# Patient Record
Sex: Female | Born: 1985 | Race: White | Hispanic: No | Marital: Single | Smoking: Never smoker
Health system: Southern US, Community
[De-identification: ages and names within clinical notes are randomized; demographics above are authoritative.]

## PROBLEM LIST (undated history)

## (undated) DIAGNOSIS — N200 Calculus of kidney: Secondary | ICD-10-CM

## (undated) DIAGNOSIS — F32A Depression, unspecified: Secondary | ICD-10-CM

## (undated) DIAGNOSIS — I10 Essential (primary) hypertension: Secondary | ICD-10-CM

## (undated) DIAGNOSIS — M109 Gout, unspecified: Secondary | ICD-10-CM

## (undated) DIAGNOSIS — E119 Type 2 diabetes mellitus without complications: Secondary | ICD-10-CM

## (undated) DIAGNOSIS — F329 Major depressive disorder, single episode, unspecified: Secondary | ICD-10-CM

## (undated) DIAGNOSIS — J45909 Unspecified asthma, uncomplicated: Secondary | ICD-10-CM

## (undated) HISTORY — PX: ANKLE SURGERY: SHX546

## (undated) HISTORY — PX: TONSILLECTOMY: SUR1361

---
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
## 2009-05-24 ENCOUNTER — Emergency Department: Payer: Self-pay | Admitting: Emergency Medicine

## 2013-05-01 DIAGNOSIS — E781 Pure hyperglyceridemia: Secondary | ICD-10-CM | POA: Insufficient documentation

## 2013-05-23 ENCOUNTER — Ambulatory Visit: Payer: Self-pay | Admitting: Family Medicine

## 2013-06-27 DIAGNOSIS — J45909 Unspecified asthma, uncomplicated: Secondary | ICD-10-CM | POA: Insufficient documentation

## 2013-08-19 DIAGNOSIS — L309 Dermatitis, unspecified: Secondary | ICD-10-CM | POA: Insufficient documentation

## 2014-01-14 DIAGNOSIS — F32A Depression, unspecified: Secondary | ICD-10-CM | POA: Insufficient documentation

## 2014-01-14 DIAGNOSIS — E785 Hyperlipidemia, unspecified: Secondary | ICD-10-CM | POA: Insufficient documentation

## 2014-01-14 DIAGNOSIS — F329 Major depressive disorder, single episode, unspecified: Secondary | ICD-10-CM | POA: Insufficient documentation

## 2014-05-01 ENCOUNTER — Ambulatory Visit: Payer: Self-pay | Admitting: Unknown Physician Specialty

## 2014-05-01 LAB — BASIC METABOLIC PANEL
Anion Gap: 6 — ABNORMAL LOW (ref 7–16)
BUN: 17 mg/dL (ref 7–18)
CALCIUM: 9.3 mg/dL (ref 8.5–10.1)
CHLORIDE: 104 mmol/L (ref 98–107)
Co2: 28 mmol/L (ref 21–32)
Creatinine: 0.64 mg/dL (ref 0.60–1.30)
EGFR (African American): >60
Glucose: 96 mg/dL (ref 65–99)
Osmolality: 277 (ref 275–301)
Potassium: 4.1 mmol/L (ref 3.5–5.1)
Sodium: 138 mmol/L (ref 136–145)

## 2014-05-06 ENCOUNTER — Ambulatory Visit: Payer: Self-pay | Admitting: Otolaryngology

## 2014-05-09 LAB — PATHOLOGY REPORT

## 2015-02-08 NOTE — Op Note (Signed)
PATIENT NAME:  Charlotte Holloway, Clotee M MR#:  [id number] DATE OF BIRTH:  July 11, 1986  DATE OF PROCEDURE:  05/06/2014  PREOPERATIVE DIAGNOSES: 1.  Chronic tonsillitis. 2.  Tonsil and adenoid hypertrophy causing airway obstruction.  3.  Hard palate lesion.   POSTOPERATIVE DIAGNOSES:  1.  Chronic tonsillitis. 2.  Tonsil and adenoid hypertrophy causing airway obstruction.  3.  Hard palate lesion.   PROCEDURES PERFORMED:   1.  Tonsillectomy and adenoidectomy.  2.  Excision hard palate lesion.   SURGEON: Cammy CopaPaul H. Neta Upadhyay, M.D.   ANESTHESIA: General.   COMPLICATIONS: None.   ESTIMATED BLOOD LOSS: 50 mL.   DESCRIPTION OF PROCEDURE: The patient was given general anesthesia by oral endotracheal intubation. Davis mouth gag was used to visualize her oropharynx. Her tonsils are huge. They are 4+ and touching the midline. She has a very large tongue as well. The soft palate was retracted. Adenoids were enlarged also. Adenoids were removed with curettage and [employer] Thompson forceps. Bleeding was controlled with direct pressure and silver nitrate cautery. The right tonsil was grasped first, pulled medially. The anterior pillar was incised. This was very scarred down to her tonsillar fossa. Tonsils were removed with electrocautery and blunt dissection. Bleeding was controlled with electrocautery. The mouth gag has to be moved to show the left tonsil. The tonsils were grasped and pulled medially. The anterior pillar is incised. It is dissected from its fossa with electrocautery and blunt dissection. Bleeding was controlled with dry pressure and electrocautery. Once we get the tonsils out there is much more room in the back of her throat. There is a much better airway as well.   The palate had been injected with 1 mL of 1% Xylocaine with epinephrine 1:100,000. The lesion is just to left of midline along the hard palate just in front of the junction of the hard and soft palate. A #11 blade is used to excise the mucosa  all the way around it and then a freer elevator is used to free it up in the area. Curved scissor is then used for trimming at its base.  Bleeding is controlled with electrocautery all the way around the outside and at the base of the lesion. This left a 1 cm hole in the mucosa of the hard palate that could not be sewn together because it is so firmly attached to the palate. There was no significant bleeding here.   The patient tolerated the procedure well. She was awakened and taken to the recovery room in satisfactory condition. There were no operative complications.    ____________________________ Cammy CopaPaul H. Cathleen Yagi, MD phj:sb D: 05/06/2014 12:46:07 ET T: 05/06/2014 13:43:58 ET JOB#: [id number]  cc: Cammy CopaPaul H. Sua Spadafora, MD, <Dictator> Cammy CopaPAUL H Evanne Matsunaga MD ELECTRONICALLY SIGNED 05/09/2014 7:53

## 2015-07-27 ENCOUNTER — Ambulatory Visit: Payer: No Typology Code available for payment source

## 2015-07-27 ENCOUNTER — Ambulatory Visit
Admission: EM | Admit: 2015-07-27 | Discharge: 2015-07-27 | Disposition: A | Discharge status: Home / Self Care | Payer: No Typology Code available for payment source | Attending: Family Medicine | Admitting: Family Medicine

## 2015-07-27 ENCOUNTER — Encounter: Payer: Self-pay | Admitting: *Deleted

## 2015-07-27 DIAGNOSIS — R109 Unspecified abdominal pain: Secondary | ICD-10-CM

## 2015-07-27 DIAGNOSIS — N132 Hydronephrosis with renal and ureteral calculous obstruction: Secondary | ICD-10-CM | POA: Diagnosis not present

## 2015-07-27 DIAGNOSIS — R100 Acute abdomen: Secondary | ICD-10-CM

## 2015-07-27 LAB — COMPREHENSIVE METABOLIC PANEL
ALK PHOS: 61 U/L (ref 38–126)
ALT: 31 U/L (ref 14–54)
ANION GAP: 10 (ref 5–15)
AST: 27 U/L (ref 15–41)
Albumin: 4.4 g/dL (ref 3.5–5.0)
BILIRUBIN TOTAL: 1 mg/dL (ref 0.3–1.2)
BUN: 17 mg/dL (ref 6–20)
CALCIUM: 8.9 mg/dL (ref 8.9–10.3)
CO2: 28 mmol/L (ref 22–32)
Chloride: 98 mmol/L — ABNORMAL LOW (ref 101–111)
Creatinine, Ser: 1 mg/dL (ref 0.44–1.00)
GFR calc non Af Amer: >60 mL/min (ref >60)
Glucose, Bld: 113 mg/dL — ABNORMAL HIGH (ref 65–99)
Potassium: 3.6 mmol/L (ref 3.5–5.1)
SODIUM: 136 mmol/L (ref 135–145)
TOTAL PROTEIN: 7.9 g/dL (ref 6.5–8.1)

## 2015-07-27 LAB — CBC WITH DIFFERENTIAL/PLATELET
BASOS ABS: 0.1 10*3/uL (ref 0–0.1)
Basophils Relative: 1 %
EOS PCT: 1 %
Eosinophils Absolute: 0.2 10*3/uL (ref 0–0.7)
HCT: 41.6 % (ref 35.0–47.0)
HEMOGLOBIN: 14.1 g/dL (ref 12.0–16.0)
LYMPHS PCT: 23 %
Lymphs Abs: 2.8 10*3/uL (ref 1.0–3.6)
MCH: 29.7 pg (ref 26.0–34.0)
MCHC: 33.8 g/dL (ref 32.0–36.0)
MCV: 87.9 fL (ref 80.0–100.0)
Monocytes Absolute: 1.2 10*3/uL — ABNORMAL HIGH (ref 0.2–0.9)
Monocytes Relative: 10 %
NEUTROS ABS: 8 10*3/uL — AB (ref 1.4–6.5)
NEUTROS PCT: 65 %
PLATELETS: 264 10*3/uL (ref 150–440)
RBC: 4.74 MIL/uL (ref 3.80–5.20)
RDW: 13.4 % (ref 11.5–14.5)
WBC: 12.2 10*3/uL — AB (ref 3.6–11.0)

## 2015-07-27 LAB — URINALYSIS COMPLETE WITH MICROSCOPIC (ARMC ONLY)
GLUCOSE, UA: NEGATIVE mg/dL
Ketones, ur: NEGATIVE mg/dL
LEUKOCYTES UA: NEGATIVE
NITRITE: NEGATIVE
Protein, ur: 100 mg/dL — AB
Specific Gravity, Urine: >1.03 — ABNORMAL HIGH (ref 1.005–1.030)
pH: 7 (ref 5.0–8.0)

## 2015-07-27 LAB — PREGNANCY, URINE: PREG TEST UR: NEGATIVE

## 2015-07-27 LAB — AMYLASE: Amylase: 46 U/L (ref 28–100)

## 2015-07-27 LAB — LIPASE, BLOOD: Lipase: 18 U/L — ABNORMAL LOW (ref 22–51)

## 2015-07-27 MED ORDER — ONDANSETRON 8 MG PO TBDP: 8.0000 mg | ORAL_TABLET | Freq: Three times a day (TID) | ORAL | Status: DC | PRN

## 2015-07-27 MED ORDER — ONDANSETRON 8 MG PO TBDP
8.0000 mg | ORAL_TABLET | Freq: Once | ORAL | Status: AC
  Administered 2015-07-27: 8 mg via ORAL

## 2015-07-27 MED ORDER — CIPROFLOXACIN HCL 500 MG PO TABS: 500.0000 mg | ORAL_TABLET | Freq: Two times a day (BID) | ORAL | Status: DC

## 2015-07-27 MED ORDER — OXYCODONE-ACETAMINOPHEN 5-325 MG PO TABS: 1.0000 | ORAL_TABLET | Freq: Three times a day (TID) | ORAL | Status: DC | PRN

## 2015-07-27 MED ORDER — TAMSULOSIN HCL 0.4 MG PO CAPS: 0.4000 mg | ORAL_CAPSULE | Freq: Every day | ORAL | Status: DC

## 2015-07-27 MED ORDER — KETOROLAC TROMETHAMINE 30 MG/ML IJ SOLN
30.0000 mg | Freq: Once | INTRAMUSCULAR | Status: AC
  Administered 2015-07-27: 30 mg via INTRAVENOUS

## 2015-07-27 NOTE — ED Provider Notes (Signed)
CSN: [id number]     Arrival date & time 07/27/15  0807 History   First MD Initiated Contact with Patient 07/27/15 [phone]    Nurse's notes were reviewed Chief Complaint  Patient presents with  . Emesis  . Abdominal Cramping   patient states that it started Friday afternoon she's not keeping anything down. (Consider location/radiation/quality/duration/timing/severity/associated sxs/prior Treatment) Patient is a 29 y.o. female presenting with vomiting and cramps. The history is provided by the patient. No language interpreter was used.  Emesis Severity:  Moderate Duration:  3 days Timing:  Constant Number of daily episodes:  Everytime she eats Quality:  Stomach contents Progression:  Unchanged Chronicity:  New Recent urination:  Increased Relieved by:  Nothing Associated symptoms: abdominal pain and sore throat   Associated symptoms: no arthralgias, no chills, no cough, no fever, no myalgias and no URI   Associated symptoms comment:  The sore throat is coming from the recurrent emesis. Abdominal pain:    Location:  Generalized   Quality:  Cramping   Severity:  Moderate   Timing:  Constant   Progression:  Worsening (When she eats the pain is worse.)   Chronicity:  New Risk factors: diabetes   Risk factors: no alcohol use, no sick contacts and no suspect food intake   Risk factors comment:  History of diabetes one time but not now Abdominal Cramping Associated symptoms include abdominal pain. Pertinent negatives include no chest pain and no shortness of breath.   Patient does not smoke. Was diagnosed diabetic but still longer consider diabetic. She states that there is suspicious that she has polycystic ovarian disease. History reviewed. No pertinent past medical history. Past Surgical History  Procedure Laterality Date  . Tonsillectomy    . Ankle surgery     History reviewed. No pertinent family history. Social History  Substance Use Topics  . Smoking status: Never Smoker   .  Smokeless tobacco: None  . Alcohol Use: No   OB History    No data available     Review of Systems  Constitutional: Negative for chills.  HENT: Positive for sore throat.   Respiratory: Negative for cough and shortness of breath.   Cardiovascular: Negative for chest pain.  Gastrointestinal: Positive for vomiting and abdominal pain.  Musculoskeletal: Negative for myalgias and arthralgias.  Skin: Positive for rash. Negative for pallor.  All other systems reviewed and are negative.   Allergies  Review of patient's allergies indicates no known allergies.  Home Medications   Prior to Admission medications   Medication Sig Start Date End Date Taking? Authorizing Provider  allopurinol (ZYLOPRIM) 300 MG tablet Take 300 mg by mouth daily.   Yes Historical Provider, MD  metFORMIN (GLUCOPHAGE) 500 MG tablet Take by mouth 2 (two) times daily with a meal.   Yes Historical Provider, MD  venlafaxine (EFFEXOR) 37.5 MG tablet Take 37.5 mg by mouth daily.   Yes Historical Provider, MD  ciprofloxacin (CIPRO) 500 MG tablet Take 1 tablet (500 mg total) by mouth 2 (two) times daily. 07/27/15   Hassan Rowan, MD  ondansetron (ZOFRAN ODT) 8 MG disintegrating tablet Take 1 tablet (8 mg total) by mouth every 8 (eight) hours as needed for nausea or vomiting. 07/27/15   Hassan Rowan, MD  oxyCODONE-acetaminophen (PERCOCET) 5-325 MG tablet Take 1 tablet by mouth every 8 (eight) hours as needed for severe pain. 07/27/15   Hassan Rowan, MD  tamsulosin (FLOMAX) 0.4 MG CAPS capsule Take 1 capsule (0.4 mg total) by mouth daily. 07/27/15  Hassan Rowan, MD   Meds Ordered and Administered this Visit   Medications  ondansetron (ZOFRAN-ODT) disintegrating tablet 8 mg (8 mg Oral Given 07/27/15 0908)  ketorolac (TORADOL) 30 MG/ML injection 30 mg (30 mg Intravenous Given 07/27/15 0908)    BP 138/88 mmHg  Pulse 93  Temp(Src) 98.3 F (36.8 C) (Oral)  Ht  (1.626 m)  Wt 232 lb (105.235 kg)  BMI 39.80 kg/m2  SpO2 99%  LMP  06/30/2015 (Approximate) No data found.   Physical Exam  Constitutional: She is oriented to person, place, and time. She appears well-developed and well-nourished.  Obese white female  HENT:  Head: Normocephalic and atraumatic.  Eyes: Conjunctivae are normal. Pupils are equal, round, and reactive to light.  Neck: Normal range of motion. Neck supple.  Cardiovascular: Normal rate, regular rhythm and normal heart sounds.   Pulmonary/Chest: Effort normal and breath sounds normal.  Abdominal: Soft. Normal appearance and bowel sounds are normal. She exhibits no distension. There is generalized tenderness.    Musculoskeletal: Normal range of motion.  Neurological: She is alert and oriented to person, place, and time.  Skin: Skin is warm and dry. No erythema.  Psychiatric: She has a normal mood and affect. Her behavior is normal.  Vitals reviewed.   ED Course  Procedures (including critical care time)  Labs Review Labs Reviewed  CBC WITH DIFFERENTIAL/PLATELET - Abnormal; Notable for the following:    WBC 12.2 (*)    Neutro Abs 8.0 (*)    Monocytes Absolute 1.2 (*)    All other components within normal limits  LIPASE, BLOOD - Abnormal; Notable for the following:    Lipase 18 (*)    All other components within normal limits  URINALYSIS COMPLETEWITH MICROSCOPIC ([facility] ONLY) - Abnormal; Notable for the following:    APPearance CLOUDY (*)    Bilirubin Urine 1+ (*)    Specific Gravity, Urine >1.030 (*)    Hgb urine dipstick 1+ (*)    Protein, ur 100 (*)    Bacteria, UA MANY (*)    Squamous Epithelial / LPF 0-5 (*)    All other components within normal limits  COMPREHENSIVE METABOLIC PANEL - Abnormal; Notable for the following:    Chloride 98 (*)    Glucose, Bld 113 (*)    All other components within normal limits  URINE CULTURE  AMYLASE  PREGNANCY, URINE    Imaging Review Ct Abdomen Pelvis Wo Contrast  07/27/2015   CLINICAL DATA:  Central abdominal pain, 3 days duration,  with nausea and vomiting. Personal history of kidney stones.  EXAM: CT ABDOMEN AND PELVIS WITHOUT CONTRAST  TECHNIQUE: Multidetector CT imaging of the abdomen and pelvis was performed following the standard protocol without IV contrast.  COMPARISON:  Ultrasound 05/23/2013.  CT 05/25/2009.  FINDINGS: There is a lingular scar. No active process seen in the lung bases. No pleural or pericardial fluid. No focal liver finding. No calcified gallstones. The spleen is at the upper limits of normal in size but does not show any focal finding. The pancreas is normal. The adrenal glands are normal. The left kidney is normal. The right kidney is swollen and there is moderate right hydronephrosis. There is an obstructing stone at the right UPJ measuring 6 x 10 mm. There is an additional nonobstructing 2 mm stone in the midportion of the right kidney. No stone more distally in the ureter. No stone in the bladder.  The aorta and IVC are normal. No retroperitoneal mass or adenopathy. No  free intraperitoneal fluid or air. No bowel pathology. Uterus and adnexal regions appear normal. No acute bone finding. Degenerative disc disease L5-S1.  IMPRESSION: 6 x 10 mm stone at the right UPJ with moderate right hydronephrosis.   Electronically Signed   By: Paulina Fusi M.D.   On: 07/27/2015 11:21    .thik Visual Acuity Review  Right Eye Distance:   Left Eye Distance:   Bilateral Distance:    Right Eye Near:   Left Eye Near:    Bilateral Near:       Results for orders placed or performed during the hospital encounter of 07/27/15  CBC with Differential  Result Value Ref Range   WBC 12.2 (H) 3.6 - 11.0 K/uL   RBC 4.74 3.80 - 5.20 MIL/uL   Hemoglobin 14.1 12.0 - 16.0 g/dL   HCT 96.0 45.4 - 09.8 %   MCV 87.9 80.0 - 100.0 fL   MCH 29.7 26.0 - 34.0 pg   MCHC 33.8 32.0 - 36.0 g/dL   RDW 11.9 14.7 - 82.9 %   Platelets 264 150 - 440 K/uL   Neutrophils Relative % 65 %   Neutro Abs 8.0 (H) 1.4 - 6.5 K/uL   Lymphocytes  Relative 23 %   Lymphs Abs 2.8 1.0 - 3.6 K/uL   Monocytes Relative 10 %   Monocytes Absolute 1.2 (H) 0.2 - 0.9 K/uL   Eosinophils Relative 1 %   Eosinophils Absolute 0.2 0 - 0.7 K/uL   Basophils Relative 1 %   Basophils Absolute 0.1 0 - 0.1 K/uL  Lipase, blood  Result Value Ref Range   Lipase 18 (L) 22 - 51 U/L  Amylase  Result Value Ref Range   Amylase 46 28 - 100 U/L  Urinalysis complete, with microscopic  Result Value Ref Range   Color, Urine YELLOW YELLOW   APPearance CLOUDY (A) CLEAR   Glucose, UA NEGATIVE NEGATIVE mg/dL   Bilirubin Urine 1+ (A) NEGATIVE   Ketones, ur NEGATIVE NEGATIVE mg/dL   Specific Gravity, Urine >1.030 (H) 1.005 - 1.030   Hgb urine dipstick 1+ (A) NEGATIVE   pH 7.0 5.0 - 8.0   Protein, ur 100 (A) NEGATIVE mg/dL   Nitrite NEGATIVE NEGATIVE   Leukocytes, UA NEGATIVE NEGATIVE   RBC / HPF 6-30 <3 RBC/hpf   WBC, UA 0-5 <3 WBC/hpf   Bacteria, UA MANY (A) RARE   Squamous Epithelial / LPF 0-5 (A) RARE  Comprehensive metabolic panel  Result Value Ref Range   Sodium 136 135 - 145 mmol/L   Potassium 3.6 3.5 - 5.1 mmol/L   Chloride 98 (L) 101 - 111 mmol/L   CO2 28 22 - 32 mmol/L   Glucose, Bld 113 (H) 65 - 99 mg/dL   BUN 17 6 - 20 mg/dL   Creatinine, Ser 5.62 0.44 - 1.00 mg/dL   Calcium 8.9 8.9 - 13.0 mg/dL   Total Protein 7.9 6.5 - 8.1 g/dL   Albumin 4.4 3.5 - 5.0 g/dL   AST 27 15 - 41 U/L   ALT 31 14 - 54 U/L   Alkaline Phosphatase 61 38 - 126 U/L   Total Bilirubin 1.0 0.3 - 1.2 mg/dL   GFR calc non Af Amer >60 >60 mL/min   GFR calc Af Amer >60 >60 mL/min   Anion gap 10 5 - 15  Pregnancy, urine  Result Value Ref Range   Preg Test, Ur NEGATIVE NEGATIVE  MDM   1. Ureteral stone with hydronephrosis   2. Abdominal pain, acute     Patient informed she has a 6 x 10 mm stone. Because of the size of stone will make a urology referral place on Flomax to try to help promote stone have a strain her urine. Cipro for what looks echo  early infection that may be starting to except the pain and Zofran for nausea. Work note for Monday and Tuesday and return back to work on Wednesday hopefully she'll see the urologist before.  Hassan Rowan, MD 07/27/15 [phone]

## 2015-07-27 NOTE — Discharge Instructions (Signed)
Hydronephrosis °Hydronephrosis is the enlargement of a kidney due to a blockage that stops urine from flowing out of the body. °CAUSES °Common causes of this condition include: °· A birth (congenital) defect of the kidney. °· A congenital defect of the tube through which urine travels (ureter). °· Kidney stones. °· An enlarged prostate gland. °· A tumor. °· Cancer of the prostate, bladder, uterus, ovary, or colon. °· A blood clot. °SYMPTOMS °Symptoms of this condition include: °· Pain or discomfort in your side (flank). °· Swelling of the abdomen. °· Pain in the abdomen. °· Nausea and vomiting. °· Fever. °· Pain while passing urine. °· Feeling of urgency to urinate. °· Frequent urination. °· Infection of the urinary tract. °In some cases, there are no symptoms. °DIAGNOSIS °This condition may be diagnosed with: °· A medical history. °· A physical exam. °· Blood and urine tests to check kidney function. °· Imaging tests, such as an X-ray, ultrasound, CT scan, or MRI. °· A test in which a rigid or flexible telescope (cystoscope) is used to view the site of the blockage. °TREATMENT °Treatment for this condition depends on where the blockage is located, how long it has been there, and what caused it. The goal of treatment is to remove the blockage. Treatment options include: °· A procedure to put in a soft tube to help drain urine. °· Antibiotic medicines to treat or prevent infection. °· Shock-wave therapy (lithotripsy) to help eliminate kidney stones. °HOME CARE INSTRUCTIONS °· Get lots of rest. °· Drink enough fluid to keep your urine clear or pale yellow. °· If you have a drain in, follow your health care provider's instructions about how to care for it. °· Take medicines only as directed by your health care provider. °· If you were prescribed an antibiotic medicine, finish all of it even if you start to feel better. °· Keep all follow-up visits as directed by your health care provider. This is important. °SEEK  MEDICAL CARE IF: °· You continue to have symptoms after treatment. °· You develop new symptoms. °· You have a problem with a drainage device. °· Your urine becomes cloudy or bloody. °· You have a fever. °SEEK IMMEDIATE MEDICAL CARE IF: °· You have severe flank or abdominal pain. °· You develop vomiting and are unable to keep fluids down. °  °This information is not intended to replace advice given to you by your health care provider. Make sure you discuss any questions you have with your health care provider. °  °Document Released: 08/01/2007 Document Revised: 02/18/2015 Document Reviewed: 09/30/2014 °Elsevier Interactive Patient Education ©2016 [employer] ° °Kidney Stones °Kidney stones (urolithiasis) are solid masses that form inside your kidneys. The intense pain is caused by the stone moving through the kidney, ureter, bladder, and urethra (urinary tract). When the stone moves, the ureter starts to spasm around the stone. The stone is usually passed in your pee (urine).  °HOME CARE °· Drink enough fluids to keep your pee clear or pale yellow. This helps to get the stone out. °· Take a 24-hour pee (urine) sample as told by your doctor. You may need to take another sample every 6-12 months. °· Strain all pee through the provided strainer. Do not pee without peeing through the strainer, not even once. If you pee the stone out, catch it in the strainer. The stone may be as small as a grain of salt. Take this to your doctor. This will help your doctor figure out what you can do   to try to prevent more kidney stones. °· Only take medicine as told by your doctor. °· Make changes to your daily diet as told by your doctor. You may be told to: °¨ Limit how much salt you eat. °¨ Eat 5 or more servings of fruits and vegetables each day. °¨ Limit how much meat, poultry, fish, and eggs you eat. °· Keep all follow-up visits as told by your doctor. This is important. °· Get follow-up X-rays as told by your doctor. °GET HELP  IF: °You have pain that gets worse even if you have been taking pain medicine. °GET HELP RIGHT AWAY IF:  °· Your pain does not get better with medicine. °· You have a fever or shaking chills. °· Your pain increases and gets worse over 18 hours. °· You have new belly (abdominal) pain. °· You feel faint or pass out. °· You are unable to pee. °  °This information is not intended to replace advice given to you by your health care provider. Make sure you discuss any questions you have with your health care provider. °  °Document Released: 03/22/2008 Document Revised: 06/25/2015 Document Reviewed: 03/07/2013 °Elsevier Interactive Patient Education ©2016 [employer] ° °

## 2015-07-27 NOTE — ED Notes (Signed)
Pt states that she has nausea, abdominal cramping and vomiting since Friday, pt states that she has not been able to eat with out vomiting

## 2015-07-28 LAB — URINE CULTURE

## 2015-07-29 NOTE — ED Notes (Signed)
Final report urine C&S suggestive of contamination ; result shows multiple species

## 2016-06-22 DIAGNOSIS — Z87442 Personal history of urinary calculi: Secondary | ICD-10-CM | POA: Insufficient documentation

## 2016-06-22 DIAGNOSIS — E119 Type 2 diabetes mellitus without complications: Secondary | ICD-10-CM | POA: Insufficient documentation

## 2019-05-23 ENCOUNTER — Encounter
Admission: EM | Disposition: A | Discharge status: Home / Self Care | Payer: Self-pay | Source: Ambulatory Visit | Attending: Urology

## 2019-05-23 ENCOUNTER — Emergency Department: Payer: BLUE CROSS/BLUE SHIELD | Admitting: Anesthesiology

## 2019-05-23 ENCOUNTER — Other Ambulatory Visit: Payer: Self-pay

## 2019-05-23 ENCOUNTER — Inpatient Hospital Stay
Admission: EM | Admit: 2019-05-23 | Discharge: 2019-05-25 | DRG: 854 | Disposition: A | Discharge status: Home / Self Care | Payer: BLUE CROSS/BLUE SHIELD | Source: Ambulatory Visit | Attending: Urology | Admitting: Urology

## 2019-05-23 ENCOUNTER — Emergency Department: Payer: BLUE CROSS/BLUE SHIELD

## 2019-05-23 ENCOUNTER — Encounter: Payer: Self-pay | Admitting: Emergency Medicine

## 2019-05-23 ENCOUNTER — Ambulatory Visit
Admission: EM | Admit: 2019-05-23 | Discharge: 2019-05-23 | Disposition: A | Discharge status: Other Acute Inpatient Hospital | Payer: BLUE CROSS/BLUE SHIELD | Source: Home / Self Care | Attending: Family Medicine | Admitting: Family Medicine

## 2019-05-23 DIAGNOSIS — B9620 Unspecified Escherichia coli [E. coli] as the cause of diseases classified elsewhere: Secondary | ICD-10-CM | POA: Diagnosis present

## 2019-05-23 DIAGNOSIS — R1011 Right upper quadrant pain: Secondary | ICD-10-CM

## 2019-05-23 DIAGNOSIS — E669 Obesity, unspecified: Secondary | ICD-10-CM | POA: Diagnosis present

## 2019-05-23 DIAGNOSIS — A419 Sepsis, unspecified organism: Secondary | ICD-10-CM

## 2019-05-23 DIAGNOSIS — N201 Calculus of ureter: Secondary | ICD-10-CM | POA: Diagnosis not present

## 2019-05-23 DIAGNOSIS — J45909 Unspecified asthma, uncomplicated: Secondary | ICD-10-CM | POA: Diagnosis present

## 2019-05-23 DIAGNOSIS — Z20828 Contact with and (suspected) exposure to other viral communicable diseases: Secondary | ICD-10-CM | POA: Diagnosis present

## 2019-05-23 DIAGNOSIS — A4151 Sepsis due to Escherichia coli [E. coli]: Principal | ICD-10-CM | POA: Diagnosis present

## 2019-05-23 DIAGNOSIS — F329 Major depressive disorder, single episode, unspecified: Secondary | ICD-10-CM | POA: Diagnosis present

## 2019-05-23 DIAGNOSIS — N132 Hydronephrosis with renal and ureteral calculous obstruction: Secondary | ICD-10-CM

## 2019-05-23 DIAGNOSIS — M109 Gout, unspecified: Secondary | ICD-10-CM | POA: Diagnosis present

## 2019-05-23 DIAGNOSIS — Z79899 Other long term (current) drug therapy: Secondary | ICD-10-CM

## 2019-05-23 DIAGNOSIS — R109 Unspecified abdominal pain: Secondary | ICD-10-CM

## 2019-05-23 DIAGNOSIS — R509 Fever, unspecified: Secondary | ICD-10-CM

## 2019-05-23 DIAGNOSIS — N200 Calculus of kidney: Secondary | ICD-10-CM

## 2019-05-23 DIAGNOSIS — N136 Pyonephrosis: Secondary | ICD-10-CM | POA: Diagnosis present

## 2019-05-23 DIAGNOSIS — Z7984 Long term (current) use of oral hypoglycemic drugs: Secondary | ICD-10-CM

## 2019-05-23 DIAGNOSIS — E119 Type 2 diabetes mellitus without complications: Secondary | ICD-10-CM | POA: Diagnosis present

## 2019-05-23 DIAGNOSIS — Z6836 Body mass index (BMI) 36.0-36.9, adult: Secondary | ICD-10-CM

## 2019-05-23 DIAGNOSIS — I10 Essential (primary) hypertension: Secondary | ICD-10-CM | POA: Diagnosis present

## 2019-05-23 DIAGNOSIS — Z87442 Personal history of urinary calculi: Secondary | ICD-10-CM

## 2019-05-23 HISTORY — DX: Unspecified asthma, uncomplicated: J45.909

## 2019-05-23 HISTORY — DX: Depression, unspecified: F32.A

## 2019-05-23 HISTORY — DX: Gout, unspecified: M10.9

## 2019-05-23 HISTORY — DX: Type 2 diabetes mellitus without complications: E11.9

## 2019-05-23 HISTORY — PX: CYSTOSCOPY WITH STENT PLACEMENT: SHX5790

## 2019-05-23 HISTORY — DX: Calculus of kidney: N20.0

## 2019-05-23 HISTORY — DX: Essential (primary) hypertension: I10

## 2019-05-23 LAB — CBC WITH DIFFERENTIAL/PLATELET
Abs Immature Granulocytes: 0.17 10*3/uL — ABNORMAL HIGH (ref 0.00–0.07)
Basophils Absolute: 0.1 10*3/uL (ref 0.0–0.1)
Basophils Relative: 1 %
Eosinophils Absolute: 0 10*3/uL (ref 0.0–0.5)
Eosinophils Relative: 0 %
HCT: 32.4 % — ABNORMAL LOW (ref 36.0–46.0)
Hemoglobin: 10.8 g/dL — ABNORMAL LOW (ref 12.0–15.0)
Immature Granulocytes: 1 %
Lymphocytes Relative: 11 %
Lymphs Abs: 1.7 10*3/uL (ref 0.7–4.0)
MCH: 28.1 pg (ref 26.0–34.0)
MCHC: 33.3 g/dL (ref 30.0–36.0)
MCV: 84.2 fL (ref 80.0–100.0)
Monocytes Absolute: 1.4 10*3/uL — ABNORMAL HIGH (ref 0.1–1.0)
Monocytes Relative: 10 %
Neutro Abs: 11.4 10*3/uL — ABNORMAL HIGH (ref 1.7–7.7)
Neutrophils Relative %: 77 %
Platelets: 406 10*3/uL — ABNORMAL HIGH (ref 150–400)
RBC: 3.85 MIL/uL — ABNORMAL LOW (ref 3.87–5.11)
RDW: 15.5 % (ref 11.5–15.5)
WBC: 14.8 10*3/uL — ABNORMAL HIGH (ref 4.0–10.5)
nRBC: 0 % (ref 0.0–0.2)

## 2019-05-23 LAB — URINALYSIS, COMPLETE (UACMP) WITH MICROSCOPIC
Glucose, UA: NEGATIVE mg/dL
Ketones, ur: NEGATIVE mg/dL
Nitrite: NEGATIVE
Protein, ur: 100 mg/dL — AB
Specific Gravity, Urine: 1.02 (ref 1.005–1.030)
WBC, UA: >50 WBC/hpf (ref 0–5)
pH: 6 (ref 5.0–8.0)

## 2019-05-23 LAB — LIPASE, BLOOD: Lipase: 22 U/L (ref 11–51)

## 2019-05-23 LAB — COMPREHENSIVE METABOLIC PANEL
ALT: 10 U/L (ref 0–44)
AST: 10 U/L — ABNORMAL LOW (ref 15–41)
Albumin: 3.3 g/dL — ABNORMAL LOW (ref 3.5–5.0)
Alkaline Phosphatase: 66 U/L (ref 38–126)
Anion gap: 12 (ref 5–15)
BUN: 14 mg/dL (ref 6–20)
CO2: 18 mmol/L — ABNORMAL LOW (ref 22–32)
Calcium: 8.3 mg/dL — ABNORMAL LOW (ref 8.9–10.3)
Chloride: 99 mmol/L (ref 98–111)
Creatinine, Ser: 1.08 mg/dL — ABNORMAL HIGH (ref 0.44–1.00)
GFR calc Af Amer: >60 mL/min (ref >60)
GFR calc non Af Amer: >60 mL/min (ref >60)
Glucose, Bld: 107 mg/dL — ABNORMAL HIGH (ref 70–99)
Potassium: 3.6 mmol/L (ref 3.5–5.1)
Sodium: 129 mmol/L — ABNORMAL LOW (ref 135–145)
Total Bilirubin: 0.7 mg/dL (ref 0.3–1.2)
Total Protein: 7.8 g/dL (ref 6.5–8.1)

## 2019-05-23 LAB — LACTIC ACID, PLASMA: Lactic Acid, Venous: 1.1 mmol/L (ref 0.5–1.9)

## 2019-05-23 LAB — POCT PREGNANCY, URINE: Preg Test, Ur: NEGATIVE

## 2019-05-23 LAB — SARS CORONAVIRUS 2 BY RT PCR (HOSPITAL ORDER, PERFORMED IN ~~LOC~~ HOSPITAL LAB): SARS Coronavirus 2: NEGATIVE

## 2019-05-23 LAB — GLUCOSE, CAPILLARY
Glucose-Capillary: 98 mg/dL (ref 70–99)
Glucose-Capillary: 119 mg/dL — ABNORMAL HIGH (ref 70–99)

## 2019-05-23 LAB — POC URINE PREG, ED: Preg Test, Ur: NEGATIVE

## 2019-05-23 SURGERY — CYSTOSCOPY, WITH STENT INSERTION
Anesthesia: General | Laterality: Right

## 2019-05-23 MED ORDER — IOHEXOL 300 MG/ML  SOLN
100.0000 mL | Freq: Once | INTRAMUSCULAR | Status: AC | PRN
  Administered 2019-05-23: 18:00:00 100 mL via INTRAVENOUS

## 2019-05-23 MED ORDER — ALBUTEROL SULFATE HFA 108 (90 BASE) MCG/ACT IN AERS
INHALATION_SPRAY | RESPIRATORY_TRACT | Status: AC
  Filled 2019-05-23: qty 6.7

## 2019-05-23 MED ORDER — MEPERIDINE HCL 50 MG/ML IJ SOLN
12.5000 mg | Freq: Once | INTRAMUSCULAR | Status: AC
  Administered 2019-05-23: 22:00:00 12.5 mg via INTRAVENOUS

## 2019-05-23 MED ORDER — ONDANSETRON HCL 4 MG/2ML IJ SOLN: 4.0000 mg | Freq: Once | INTRAMUSCULAR | Status: DC

## 2019-05-23 MED ORDER — FENTANYL CITRATE (PF) 100 MCG/2ML IJ SOLN
INTRAMUSCULAR | Status: DC | PRN
  Administered 2019-05-23: 50 ug via INTRAVENOUS

## 2019-05-23 MED ORDER — LIDOCAINE HCL (CARDIAC) PF 100 MG/5ML IV SOSY
PREFILLED_SYRINGE | INTRAVENOUS | Status: DC | PRN
  Administered 2019-05-23: 50 mg via INTRAVENOUS

## 2019-05-23 MED ORDER — ONDANSETRON HCL 4 MG/2ML IJ SOLN: 4.0000 mg | Freq: Once | INTRAMUSCULAR | Status: DC | PRN

## 2019-05-23 MED ORDER — SUCCINYLCHOLINE CHLORIDE 20 MG/ML IJ SOLN
INTRAMUSCULAR | Status: DC | PRN
  Administered 2019-05-23: 100 mg via INTRAVENOUS

## 2019-05-23 MED ORDER — MORPHINE SULFATE (PF) 4 MG/ML IV SOLN
4.0000 mg | Freq: Once | INTRAVENOUS | Status: AC
  Administered 2019-05-23: 17:00:00 4 mg via INTRAVENOUS
  Filled 2019-05-23: qty 1

## 2019-05-23 MED ORDER — PROPOFOL 10 MG/ML IV BOLUS
INTRAVENOUS | Status: AC
  Filled 2019-05-23: qty 20

## 2019-05-23 MED ORDER — SODIUM CHLORIDE 0.9 % IV BOLUS
500.0000 mL | Freq: Once | INTRAVENOUS | Status: AC
  Administered 2019-05-23: 17:00:00 500 mL via INTRAVENOUS

## 2019-05-23 MED ORDER — MIDAZOLAM HCL 2 MG/2ML IJ SOLN
INTRAMUSCULAR | Status: AC
  Administered 2019-05-23: 21:00:00 1 mg via INTRAVENOUS
  Filled 2019-05-23: qty 2

## 2019-05-23 MED ORDER — ONDANSETRON HCL 4 MG/2ML IJ SOLN
4.0000 mg | Freq: Once | INTRAMUSCULAR | Status: AC
  Administered 2019-05-23: 17:00:00 4 mg via INTRAVENOUS
  Filled 2019-05-23: qty 2

## 2019-05-23 MED ORDER — ACETAMINOPHEN 500 MG PO TABS
1000.0000 mg | ORAL_TABLET | Freq: Once | ORAL | Status: AC
  Administered 2019-05-23: 23:00:00 1000 mg via ORAL

## 2019-05-23 MED ORDER — PROPOFOL 10 MG/ML IV BOLUS
INTRAVENOUS | Status: DC | PRN
  Administered 2019-05-23: 150 mg via INTRAVENOUS

## 2019-05-23 MED ORDER — IPRATROPIUM-ALBUTEROL 0.5-2.5 (3) MG/3ML IN SOLN
3.0000 mL | Freq: Once | RESPIRATORY_TRACT | Status: AC
  Administered 2019-05-23: 22:00:00 3 mL via RESPIRATORY_TRACT

## 2019-05-23 MED ORDER — LABETALOL HCL 5 MG/ML IV SOLN
5.0000 mg | INTRAVENOUS | Status: AC | PRN
  Administered 2019-05-23 (×3): 5 mg via INTRAVENOUS

## 2019-05-23 MED ORDER — IOHEXOL 240 MG/ML SOLN
50.0000 mL | Freq: Once | INTRAMUSCULAR | Status: AC | PRN
  Administered 2019-05-23: 16:00:00 50 mL via ORAL

## 2019-05-23 MED ORDER — MIDAZOLAM HCL 2 MG/2ML IJ SOLN
INTRAMUSCULAR | Status: AC
  Filled 2019-05-23: qty 2

## 2019-05-23 MED ORDER — SODIUM CHLORIDE 0.9 % IV BOLUS
1000.0000 mL | Freq: Once | INTRAVENOUS | Status: AC
  Administered 2019-05-23: 1000 mL via INTRAVENOUS

## 2019-05-23 MED ORDER — MIDAZOLAM HCL 2 MG/2ML IJ SOLN
INTRAMUSCULAR | Status: DC | PRN
  Administered 2019-05-23: 2 mg via INTRAVENOUS

## 2019-05-23 MED ORDER — GENTAMICIN SULFATE 40 MG/ML IJ SOLN
5.0000 mg/kg | INTRAVENOUS | Status: DC
  Filled 2019-05-23: qty 11.75

## 2019-05-23 MED ORDER — FENTANYL CITRATE (PF) 100 MCG/2ML IJ SOLN: 25.0000 ug | INTRAMUSCULAR | Status: DC | PRN

## 2019-05-23 MED ORDER — SODIUM CHLORIDE 0.9 % IV SOLN
1.0000 g | Freq: Once | INTRAVENOUS | Status: AC
  Administered 2019-05-23: 17:00:00 1 g via INTRAVENOUS
  Filled 2019-05-23: qty 10

## 2019-05-23 MED ORDER — LABETALOL HCL 5 MG/ML IV SOLN
INTRAVENOUS | Status: AC
  Administered 2019-05-23: 22:00:00 5 mg via INTRAVENOUS
  Filled 2019-05-23: qty 4

## 2019-05-23 MED ORDER — IOTHALAMATE MEGLUMINE 43 % IV SOLN
INTRAVENOUS | Status: DC | PRN
  Administered 2019-05-23: 10 mL via SURGICAL_CAVITY

## 2019-05-23 MED ORDER — ACETAMINOPHEN 500 MG PO TABS
1000.0000 mg | ORAL_TABLET | ORAL | Status: AC
  Administered 2019-05-23: 16:00:00 1000 mg via ORAL
  Filled 2019-05-23: qty 2

## 2019-05-23 MED ORDER — MEPERIDINE HCL 50 MG/ML IJ SOLN
INTRAMUSCULAR | Status: AC
  Administered 2019-05-23: 22:00:00 12.5 mg via INTRAVENOUS
  Filled 2019-05-23: qty 1

## 2019-05-23 MED ORDER — GENTAMICIN SULFATE 40 MG/ML IJ SOLN
5.0000 mg/kg | INTRAVENOUS | Status: DC
  Administered 2019-05-23: 20:00:00 474 mg via INTRAVENOUS
  Filled 2019-05-23 (×2): qty 11.75

## 2019-05-23 MED ORDER — SODIUM CHLORIDE 0.9 % IV SOLN
INTRAVENOUS | Status: DC | PRN
  Administered 2019-05-23: 20:00:00 via INTRAVENOUS

## 2019-05-23 MED ORDER — IPRATROPIUM-ALBUTEROL 0.5-2.5 (3) MG/3ML IN SOLN
RESPIRATORY_TRACT | Status: AC
  Administered 2019-05-23: 22:00:00 3 mL via RESPIRATORY_TRACT
  Filled 2019-05-23: qty 3

## 2019-05-23 MED ORDER — ACETAMINOPHEN 500 MG PO TABS: 1000.0000 mg | ORAL_TABLET | Freq: Once | ORAL | Status: DC

## 2019-05-23 MED ORDER — ACETAMINOPHEN 500 MG PO TABS: 500.0000 mg | ORAL_TABLET | Freq: Once | ORAL | Status: DC

## 2019-05-23 MED ORDER — ONDANSETRON HCL 4 MG/2ML IJ SOLN
INTRAMUSCULAR | Status: DC | PRN
  Administered 2019-05-23: 4 mg via INTRAVENOUS

## 2019-05-23 MED ORDER — MIDAZOLAM HCL 2 MG/2ML IJ SOLN
1.0000 mg | Freq: Once | INTRAMUSCULAR | Status: AC
  Administered 2019-05-23: 21:00:00 1 mg via INTRAVENOUS

## 2019-05-23 MED ORDER — ACETAMINOPHEN 500 MG PO TABS
ORAL_TABLET | ORAL | Status: AC
  Administered 2019-05-23: 23:00:00 1000 mg via ORAL
  Filled 2019-05-23: qty 2

## 2019-05-23 MED ORDER — FENTANYL CITRATE (PF) 100 MCG/2ML IJ SOLN
INTRAMUSCULAR | Status: AC
  Filled 2019-05-23: qty 2

## 2019-05-23 SURGICAL SUPPLY — 23 items
BAG DRAIN CYSTO-URO LG1000N (MISCELLANEOUS) ×3 IMPLANT
BAG URO DRAIN 4000ML (MISCELLANEOUS) ×3 IMPLANT
BRUSH SCRUB EZ  4% CHG (MISCELLANEOUS)
BRUSH SCRUB EZ 4% CHG (MISCELLANEOUS) IMPLANT
CATH FOL 2WAY LX 16X30 (CATHETERS) ×3 IMPLANT
CATH URETL 5X70 OPEN END (CATHETERS) ×3 IMPLANT
CONRAY 43 FOR UROLOGY 50M (MISCELLANEOUS) ×3 IMPLANT
COVER WAND RF STERILE (DRAPES) ×3 IMPLANT
GLOVE BIO SURGEON STRL SZ 6.5 (GLOVE) ×2 IMPLANT
GLOVE BIO SURGEONS STRL SZ 6.5 (GLOVE) ×1
GOWN STRL REUS W/ TWL LRG LVL3 (GOWN DISPOSABLE) ×2 IMPLANT
GOWN STRL REUS W/TWL LRG LVL3 (GOWN DISPOSABLE) ×4
GUIDEWIRE STR DUAL SENSOR (WIRE) ×3 IMPLANT
KIT TURNOVER CYSTO (KITS) ×3 IMPLANT
PACK CYSTO AR (MISCELLANEOUS) ×3 IMPLANT
SET CYSTO W/LG BORE CLAMP LF (SET/KITS/TRAYS/PACK) ×3 IMPLANT
SOL .9 NS 3000ML IRR  AL (IV SOLUTION) ×2
SOL .9 NS 3000ML IRR UROMATIC (IV SOLUTION) ×1 IMPLANT
STENT URET 6FRX24 CONTOUR (STENTS) ×3 IMPLANT
STENT URET 6FRX26 CONTOUR (STENTS) IMPLANT
SURGILUBE 2OZ TUBE FLIPTOP (MISCELLANEOUS) ×3 IMPLANT
SYRINGE IRR TOOMEY STRL 70CC (SYRINGE) ×3 IMPLANT
WATER STERILE IRR 1000ML POUR (IV SOLUTION) ×3 IMPLANT

## 2019-05-23 NOTE — ED Provider Notes (Signed)
[facility] Emergency Department Provider Note  ____________________________________________   First MD Initiated Contact with Patient 05/23/19 1558     (approximate)  I have reviewed the triage vital signs and the nursing notes.   HISTORY  Chief Complaint Flank Pain   HPI Charlotte Holloway is a 33 y.o. female here for evaluation of right flank and right back pain with fever  Patient reports for the last couple days she has not felt well, she has been experiencing discomfort in her right mid flank area along with fever, has vomited a couple times due to "pain".  Reports she is had some waxing and waning episodes of pain located in her right upper abdomen and right flank.  Denies pregnancy.  No vaginal bleeding.  No lower or pelvic pain.  Has not noticed any trouble urinating, no dark or abnormal odor.  No chest pain.  No exposure anyone known to have coronavirus.  No cough.  No runny nose no upper respiratory symptoms  Went to urgent care, they evaluated her and recommended she come here to be further evaluated and possibly have a CT scan to check see if she could have a kidney stone or problem with her gallbladder   Past Medical History:  Diagnosis Date   Asthma    Depression    Diabetes mellitus without complication (HCC)    Gout    Hypertension    Kidney stone     There are no active problems to display for this patient.   Past Surgical History:  Procedure Laterality Date   ANKLE SURGERY     TONSILLECTOMY      Prior to Admission medications   Medication Sig Start Date End Date Taking? Authorizing Provider  allopurinol (ZYLOPRIM) 300 MG tablet Take 300 mg by mouth daily.    [provider]  metFORMIN (GLUCOPHAGE) 500 MG tablet Take by mouth 2 (two) times daily with a meal.    [provider]  tamsulosin (FLOMAX) 0.4 MG CAPS capsule Take 1 capsule (0.4 mg total) by mouth daily. 07/27/15   Hassan RowanWade, Eugene, MD    venlafaxine (EFFEXOR) 37.5 MG tablet Take 37.5 mg by mouth daily.    [provider]    Allergies Patient has no known allergies.  History reviewed. No pertinent family history.  Social History Social History   Tobacco Use   Smoking status: Never Smoker   Smokeless tobacco: Never Used  Substance Use Topics   Alcohol use: No   Drug use: No    Review of Systems Constitutional: Fever and chills and some fatigue Eyes: No visual changes. ENT: No sore throat. Cardiovascular: Denies chest pain. Respiratory: Denies shortness of breath. Gastrointestinal: See HPI.  No black or bloody vomit.  Has had a couple episodes of vomiting.  No loose stools or diarrhea Genitourinary: Negative for dysuria. Musculoskeletal: Negative for back pain except over her right mid flank and radiates some towards her right back at times. Skin: Negative for rash. Neurological: Negative for headaches, areas of focal weakness or numbness.    ____________________________________________   PHYSICAL EXAM:  VITAL SIGNS: ED Triage Vitals  Enc Vitals Group     BP 05/23/19 1552 130/84     Pulse Rate 05/23/19 1552 (!) 110     Resp 05/23/19 1552 14     Temp 05/23/19 1552 (!) 102.8 F (39.3 C)     Temp Source 05/23/19 1552 Oral     SpO2 05/23/19 1552 100 %  Weight 05/23/19 1553 209 lb (94.8 kg)     Height 05/23/19 1553 5\' 4"  (1.626 m)     Head Circumference --      Peak Flow --      Pain Score 05/23/19 1553 5     Pain Loc --      Pain Edu? --      Excl. in [place]? --     Constitutional: Alert and oriented.  He ill appearing and in no acute distress.  Very pleasant Eyes: Conjunctivae are normal. Head: Atraumatic. Nose: No congestion/rhinnorhea. Mouth/Throat: Mucous membranes are slightly dry. Neck: No stridor.  Cardiovascular: Minimally tachycardic rate, regular rhythm. Grossly normal heart sounds.  Good peripheral circulation. Respiratory: Normal respiratory effort.  No retractions.  Lungs CTAB. Gastrointestinal: Soft and nontender except in the right flank and right upper quadrant also some right-sided CVA tenderness. No distention.  No focal pain McBurney's point.  No left-sided abdominal pain. Musculoskeletal: No lower extremity tenderness nor edema. Neurologic:  Normal speech and language. No gross focal neurologic deficits are appreciated.  Skin:  Skin is warm, dry and intact. No rash noted. Psychiatric: Mood and affect are normal. Speech and behavior are normal.  ____________________________________________   LABS (all labs ordered are listed, but only abnormal results are displayed)  Labs Reviewed  URINE CULTURE  CULTURE, BLOOD (ROUTINE X 2)  CULTURE, BLOOD (ROUTINE X 2)  SARS CORONAVIRUS 2 (HOSPITAL ORDER, [place] [facility])  LACTIC ACID, PLASMA  POC URINE PREG, ED   ____________________________________________  EKG   ____________________________________________  RADIOLOGY  Ct Abdomen Pelvis W Contrast  Result Date: 05/23/2019 CLINICAL DATA:  Right-sided flank pain, nausea, vomiting and fever. EXAM: CT ABDOMEN AND PELVIS WITH CONTRAST TECHNIQUE: Multidetector CT imaging of the abdomen and pelvis was performed using the standard protocol following bolus administration of intravenous contrast. CONTRAST:  147mL OMNIPAQUE IOHEXOL 300 MG/ML  SOLN COMPARISON:  CT of the abdomen and pelvis without contrast on 07/24/2015 FINDINGS: Lower chest: No acute abnormality. Hepatobiliary: No focal liver abnormality is seen. No gallstones, gallbladder wall thickening, or biliary dilatation. Pancreas: Unremarkable. No pancreatic ductal dilatation or surrounding inflammatory changes. Spleen: Normal in size without focal abnormality. Adrenals/Urinary Tract: Adrenal glands are unremarkable. Severe right-sided hydronephrosis and hydroureter present secondary to a calculus located in the distal ureter just above the ureterovesical junction and measuring  approximately 9 x 11 x 12 mm. No additional calculi identified. No evidence of left-sided hydronephrosis. The bladder is unremarkable. Stomach/Bowel: Bowel shows no evidence of obstruction, ileus, inflammation or lesion. No free air identified. Vascular/Lymphatic: There are some scattered small upper to mid abdominal retroperitoneal lymph nodes not seen previously. The largest is in anterior aortocaval node measuring 14 mm in short axis. Reproductive: Probable right-sided uterine fibroid. Cyst along the medial aspect of the right ovary is likely physiologic in measures roughly 2.7 cm. Other: No abdominal wall hernia or abnormality. No abdominopelvic ascites. Musculoskeletal: No acute or significant osseous findings. IMPRESSION: 1. Severe right-sided hydronephrosis and hydroureter secondary to an obstructing calculus in the distal ureter nearly at the ureterovesical junction and measuring 12 mm in greatest diameter. 2. New nonspecific upper to mid abdominal retroperitoneal lymph nodes which are small to mildly enlarged in size with the largest measuring 14 mm in short axis. These have enlarged since the prior CT. Electronically Signed   By: Aletta Edouard M.D.   On: 05/23/2019 18:41    ____________________________________________   PROCEDURES  Procedure(s) performed: None  Procedures  Critical Care  performed: Yes, see critical care note(s)  CRITICAL CARE Performed by: Sharyn CreamerMark Lorik Guo   Total critical care time: 30 minutes  Critical care time was exclusive of separately billable procedures and treating other patients.  Critical care was necessary to treat or prevent imminent or life-threatening deterioration.  Critical care was time spent personally by me on the following activities: development of treatment plan with patient and/or surrogate as well as nursing, discussions with consultants, evaluation of patient's response to treatment, examination of patient, obtaining history from patient or  surrogate, ordering and performing treatments and interventions, ordering and review of laboratory studies, ordering and review of radiographic studies, pulse oximetry and re-evaluation of patient's condition.  Febrile, evidence of UTI, tachycardia, patient meets sepsis criteria.  Early antibiotic initiation, fluid bolus.  Patient does not have acute lactic acidosis or hypotension.  No evidence of endorgan failure denoted ____________________________________________   INITIAL IMPRESSION / ASSESSMENT AND PLAN / ED COURSE  Pertinent labs & imaging results that were available during my care of the patient were reviewed by me and considered in my medical decision making (see chart for details).   Differential diagnosis includes but is not limited to, abdominal perforation, aortic dissection, cholecystitis, appendicitis, diverticulitis, colitis, esophagitis/gastritis, kidney stone, pyelonephritis, urinary tract infection, aortic aneurysm. All are considered in decision and treatment plan. Based upon the patient's presentation and risk factors, initiate antibiotics proceed with CT imaging      ----------------------------------------- 7:04 PM on 05/23/2019 ----------------------------------------- Patient has been seen by urology now Dr. Apolinar JunesBrandon who plans to take patient for intervention.  Patient n.p.o., patient comfortable pain improved.  Admitted to urology service.  ____________________________________________   FINAL CLINICAL IMPRESSION(S) / ED DIAGNOSES  Final diagnoses:  Sepsis, due to unspecified organism, unspecified whether acute organ dysfunction present [facility](HCC)  Kidney stone  Enlarged lymph nodes      Note:  This document was prepared using Dragon voice recognition software and may include unintentional dictation errors       Sharyn CreamerQuale, Norma Ignasiak, MD 05/23/19 1905

## 2019-05-23 NOTE — ED Notes (Signed)
Pt taken to OR.

## 2019-05-23 NOTE — ED Provider Notes (Addendum)
[facility]    CSN: [id number] Arrival date & time: 05/23/19  1244  History   Chief Complaint Chief Complaint  Patient presents with  . Fever  . Abdominal Pain  . Emesis   HPI  33 year old female presents with fever, abdominal pain, flank pain, nausea and vomiting.  Patient reports that her symptoms started Sunday night/early Monday morning.  She reports fever, T-max 104.  She has had nausea and vomiting as well.  Also reports associated chills.  Endorses right flank pain as well as right upper quadrant pain.  Reports a history of kidney stone that was passed spontaneously after being treated with Flomax.  Poor appetite.  Feels poorly.  Rates her pain as  4/10 in severity.  No known inciting factor.  No relieving factors.  No other complaints.  History reviewed and updated as below.  Past Medical History:  Diagnosis Date  . Depression   . Diabetes mellitus without complication ([place])   . Gout   . Kidney stone    Past Surgical History:  Procedure Laterality Date  . ANKLE SURGERY    . TONSILLECTOMY     OB History   No obstetric history on file.    Home Medications    Prior to Admission medications   Medication Sig Start Date End Date Taking? Authorizing Provider  allopurinol (ZYLOPRIM) 300 MG tablet Take 300 mg by mouth daily.   Yes [provider]  metFORMIN (GLUCOPHAGE) 500 MG tablet Take by mouth 2 (two) times daily with a meal.   Yes [provider]  tamsulosin (FLOMAX) 0.4 MG CAPS capsule Take 1 capsule (0.4 mg total) by mouth daily. 07/27/15  Yes Frederich Cha, MD  venlafaxine (EFFEXOR) 37.5 MG tablet Take 37.5 mg by mouth daily.   Yes [provider]   Social History Social History   Tobacco Use  . Smoking status: Never Smoker  . Smokeless tobacco: Never Used  Substance Use Topics  . Alcohol use: No  . Drug use: No     Allergies   Patient has no known allergies.   Review of Systems Review of Systems   Constitutional: Positive for fever.  Gastrointestinal: Positive for abdominal pain, nausea and vomiting.  Genitourinary: Positive for flank pain.   Physical Exam Triage Vital Signs ED Triage Vitals  Enc Vitals Group     BP 05/23/19 1328 138/87     Pulse Rate 05/23/19 1328 (!) 120     Resp 05/23/19 1328 20     Temp 05/23/19 1328 (!) 103.2 F (39.6 C)     Temp Source 05/23/19 1328 Oral     SpO2 05/23/19 1328 100 %     Weight 05/23/19 1321 209 lb (94.8 kg)     Height 05/23/19 1321 5\' 4"  (1.626 m)     Head Circumference --      Peak Flow --      Pain Score 05/23/19 1321 4     Pain Loc --      Pain Edu? --      Excl. in [place]? --    Updated Vital Signs BP 138/87 (BP Location: Right Arm)   Pulse (!) 120   Temp (!) 103.2 F (39.6 C) (Oral)   Resp 20   Ht 5\' 4"  (1.626 m)   Wt 94.8 kg   LMP 05/17/2019   SpO2 100%   BMI 35.87 kg/m   Visual Acuity Right Eye Distance:   Left Eye Distance:   Bilateral Distance:  Right Eye Near:   Left Eye Near:    Bilateral Near:     Physical Exam Vitals signs and nursing note reviewed.  Constitutional:      Appearance: She is obese. She is ill-appearing.  HENT:     Head: Normocephalic and atraumatic.  Eyes:     General:        Right eye: No discharge.        Left eye: No discharge.     Conjunctiva/sclera: Conjunctivae normal.  Cardiovascular:     Rate and Rhythm: Regular rhythm. Tachycardia present.  Pulmonary:     Effort: Pulmonary effort is normal.     Breath sounds: No wheezing, rhonchi or rales.  Abdominal:     Palpations: Abdomen is soft.     Comments: Protuberant abdomen.  Nondistended.  Exquisite right upper quadrant tenderness to palpation.  Also has right CVA tenderness.  Neurological:     Mental Status: She is alert.  Psychiatric:        Mood and Affect: Mood normal.        Behavior: Behavior normal.    UC Treatments / Results  Labs (all labs ordered are listed, but only abnormal results are displayed) Labs  Reviewed  URINALYSIS, COMPLETE (UACMP) WITH MICROSCOPIC - Abnormal; Notable for the following components:      Result Value   APPearance CLOUDY (*)    Hgb urine dipstick LARGE (*)    Bilirubin Urine SMALL (*)    Protein, ur 100 (*)    Leukocytes,Ua SMALL (*)    Bacteria, UA MANY (*)    All other components within normal limits  CBC WITH DIFFERENTIAL/PLATELET - Abnormal; Notable for the following components:   WBC 14.8 (*)    RBC 3.85 (*)    Hemoglobin 10.8 (*)    HCT 32.4 (*)    Platelets 406 (*)    Neutro Abs 11.4 (*)    Monocytes Absolute 1.4 (*)    Abs Immature Granulocytes 0.17 (*)    All other components within normal limits  COMPREHENSIVE METABOLIC PANEL - Abnormal; Notable for the following components:   Sodium 129 (*)    CO2 18 (*)    Glucose, Bld 107 (*)    Creatinine, Ser 1.08 (*)    Calcium 8.3 (*)    Albumin 3.3 (*)    AST 10 (*)    All other components within normal limits  NOVEL CORONAVIRUS, NAA (HOSPITAL ORDER, SEND-OUT TO REF LAB)  GLUCOSE, CAPILLARY  LIPASE, BLOOD  CBG MONITORING, ED    EKG   Radiology No results found.  Procedures Procedures (including critical care time)  Medications Ordered in UC Medications  ondansetron (ZOFRAN) injection 4 mg (has no administration in time range)  sodium chloride 0.9 % bolus 1,000 mL (1,000 mLs Intravenous New Bag/Given 05/23/19 1418)    Initial Impression / Assessment and Plan / UC Course  I have reviewed the triage vital signs and the nursing notes.  Pertinent labs & imaging results that were available during my care of the patient were reviewed by me and considered in my medical decision making (see chart for details).    33 year old female presents with fever, abdominal pain, flank pain. Labs revealed leukocytosis, mild AKI. Pyuria on UA. Patient meets criteria for sepsis. Etiology unclear at this time. DDx: Urosepsis from obstructing stone, pyelonephritis, cholecystitis. Sending to ER via EMS for CT  imaging/further eval.  Final Clinical Impressions(s) / UC Diagnoses   Final diagnoses:  RUQ  abdominal pain  Flank pain  Fever, unspecified fever cause   Discharge Instructions   None    ED Prescriptions    None     Controlled Substance Prescriptions [place] Controlled Substance Registry consulted? Not Applicable   Tommie SamsCook, Ravindra Baranek G, DO 05/23/19 1527    Everlene Otherook, Taunja Brickner G, DO 05/23/19 2125

## 2019-05-23 NOTE — Anesthesia Post-op Follow-up Note (Signed)
Anesthesia QCDR form completed.        

## 2019-05-23 NOTE — H&P (View-Only) (Signed)
Urology Consult/ H&P  I have been asked to see the patient by Dr. Fanny BienQuale, for evaluation and management of right flank pain/right ureteral stone/right hydronephrosis.  Chief Complaint: Right flank pain  History of Present Illness: [name] is a 33 y.o. year old a personal history of nephrolithiasis presenting earlier today to urgent care later transferred to the emergency room with fevers to 10 104, tachycardia, leukocytosis to 14, and a positive urinalysis all concerning for kidney stone.  She underwent a CT abdomen pelvis with contrast revealing a large greater than 1 cm right distal ureteral calculus with severe hydroureteronephrosis down to the level.  She continues to have significant pain.  This been going on since Sunday.  She has had associated nausea and vomiting.    No gross hematuria.  No dysuria.  She does have a personal history of kidney stones.  She supposedly passed a 10 mm right UPJ stone in 2016 spontaneously.  She has have multiple medical comorbidities including diabetes as listed below.  Past Medical History:  Diagnosis Date  . Asthma   . Depression   . Diabetes mellitus without complication (HCC)   . Gout   . Hypertension   . Kidney stone     Past Surgical History:  Procedure Laterality Date  . ANKLE SURGERY    . TONSILLECTOMY      Home Medications:  Allopurinol 300 mg daily Metformin 500 mg twice daily Venlafaxine 37.5 mg daily  Allergies: No Known Allergies  No family history on file.  Social History:  reports that she has never smoked. She has never used smokeless tobacco. She reports that she does not drink alcohol or use drugs.  ROS: A complete review of systems was performed.  All systems are negative except for pertinent findings as noted.  Physical Exam:  Vital signs in last 24 hours: Temp:  [101.1 F (38.4 C)-103.2 F (39.6 C)] 101.1 F (38.4 C) (08/05 1710) Pulse Rate:  [107-120] 107 (08/05 1730) Resp:  [14-20] 14  (08/05 1552) BP: (130-150)/(79-87) 141/79 (08/05 1730) SpO2:  [95 %-100 %] 95 % (08/05 1730) Weight:  [94.8 kg] 94.8 kg (08/05 1553) Constitutional:  Alert and oriented, No acute distress Endo: Facial hirsutism HEENT: [place] AT, moist mucus membranes.  Trachea midline, no masses Cardiovascular: Regular rate and rhythm, no clubbing, cyanosis, or edema. Respiratory: Normal respiratory effort, lungs clear bilaterally GI: Abdomen is soft, nontender, nondistended, no abdominal masses, obese GU: Mild right CVA tenderness Skin: No rashes, bruises or suspicious lesions Neurologic: Grossly intact, no focal deficits, moving all 4 extremities Psychiatric: Normal mood and affect   Laboratory Data:  Recent Labs    05/23/19 1414  WBC 14.8*  HGB 10.8*  HCT 32.4*   Recent Labs    05/23/19 1414  NA 129*  K 3.6  CL 99  CO2 18*  GLUCOSE 107*  BUN 14  CREATININE 1.08*  CALCIUM 8.3*   Component     Latest Ref Rng & Units 05/23/2019  Color, Urine     YELLOW YELLOW  Appearance     CLEAR CLOUDY (A)  Specific Gravity, Urine     1.005 - 1.030 1.020  pH     5.0 - 8.0 6.0  Glucose, UA     NEGATIVE mg/dL NEGATIVE  Hgb urine dipstick     NEGATIVE LARGE (A)  Bilirubin Urine     NEGATIVE SMALL (A)  Ketones, ur     NEGATIVE mg/dL NEGATIVE  Protein  NEGATIVE mg/dL 100 (A)  Nitrite     NEGATIVE NEGATIVE  Leukocytes,Ua     NEGATIVE SMALL (A)  Squamous Epithelial / LPF     0 - 5 6-10  WBC, UA     0 - 5 WBC/hpf >50  RBC / HPF     0 - 5 RBC/hpf 0-5  Bacteria, UA     NONE SEEN MANY (A)    Radiologic Imaging: CLINICAL DATA:  Right-sided flank pain, nausea, vomiting and fever.  EXAM: CT ABDOMEN AND PELVIS WITH CONTRAST  TECHNIQUE: Multidetector CT imaging of the abdomen and pelvis was performed using the standard protocol following bolus administration of intravenous contrast.  CONTRAST:  148mL OMNIPAQUE IOHEXOL 300 MG/ML  SOLN  COMPARISON:  CT of the abdomen and pelvis  without contrast on 07/24/2015  FINDINGS: Lower chest: No acute abnormality.  Hepatobiliary: No focal liver abnormality is seen. No gallstones, gallbladder wall thickening, or biliary dilatation.  Pancreas: Unremarkable. No pancreatic ductal dilatation or surrounding inflammatory changes.  Spleen: Normal in size without focal abnormality.  Adrenals/Urinary Tract: Adrenal glands are unremarkable. Severe right-sided hydronephrosis and hydroureter present secondary to a calculus located in the distal ureter just above the ureterovesical junction and measuring approximately 9 x 11 x 12 mm. No additional calculi identified. No evidence of left-sided hydronephrosis. The bladder is unremarkable.  Stomach/Bowel: Bowel shows no evidence of obstruction, ileus, inflammation or lesion. No free air identified.  Vascular/Lymphatic: There are some scattered small upper to mid abdominal retroperitoneal lymph nodes not seen previously. The largest is in anterior aortocaval node measuring 14 mm in short axis.  Reproductive: Probable right-sided uterine fibroid. Cyst along the medial aspect of the right ovary is likely physiologic in measures roughly 2.7 cm.  Other: No abdominal wall hernia or abnormality. No abdominopelvic ascites.  Musculoskeletal: No acute or significant osseous findings.  IMPRESSION: 1. Severe right-sided hydronephrosis and hydroureter secondary to an obstructing calculus in the distal ureter nearly at the ureterovesical junction and measuring 12 mm in greatest diameter. 2. New nonspecific upper to mid abdominal retroperitoneal lymph nodes which are small to mildly enlarged in size with the largest measuring 14 mm in short axis. These have enlarged since the prior CT.   Electronically Signed   By: Aletta Edouard M.D.   On: 05/23/2019 18:41  CT scan was personally reviewed.  Agree with radiologic interpretation.  Impression/Assessment:   33 year old female with large 58mm obstructing right distal ureteral calculus with signs and symptoms of sepsis including high fever to 104, tachycardia, leukocytosis, positive urinalysis.  I recommended proceeding emergently to the operating room for cystoscopy, attempted right ureteral stent placement.  Given the size of the stone, there is a risk that we may have some difficulty and if this fails, she will need a right percutaneous nephrostomy tube.  Risks and benefits of his stent were discussed in detail clear risk of bleeding, infection, damage surrounding structures, risk of failure of stent placement, risk of temporary worsening of sepsis, amongst others.  All questions were answered.  She understands that she will need a staged procedure for definitive management of her stone down the road once her infection is cleared.  She received IV ceftriaxone at 16: 49, 1 g  Plan:  -2 OR for attempted right ureteral stent placement -Consent reviewed with patient personally, site marked -Plan to broaden antibiotics perioperatively with extended dose of gentamicin -Admit postop for IV antibiotics and continued supportive care -Home medicines  -Covid testing pending  05/23/2019, 6:23  PM  Madisun Hargrove,  MD         

## 2019-05-23 NOTE — ED Triage Notes (Signed)
Patient c/o fever, vomiting and abdominal pain that started Sunday night.

## 2019-05-23 NOTE — Care Management (Signed)
She complained of chest tightness. Quite anxious. - versed 1-2 mg given. SVN with duoneb. HR came down from 150 to 120 range. After 15 mg of labetelol. EKG normal. She is comfortable now. May be d/c'd to floor.

## 2019-05-23 NOTE — ED Notes (Addendum)
Dr. Brandon at bedside.  

## 2019-05-23 NOTE — ED Notes (Signed)
Report given to OR.

## 2019-05-23 NOTE — ED Triage Notes (Signed)
Pt arrives via EMS from [facility] urgent care after having flank pain since Sunday night- denies pain with urination, denies blood in urine- pt had labs and urine sample done at urgent care

## 2019-05-23 NOTE — Transfer of Care (Signed)
Immediate Anesthesia Transfer of Care Note  Patient: Charlotte Holloway  Procedure(s) Performed: CYSTOSCOPY WITH STENT PLACEMENT (Right )  Patient Location: PACU  Anesthesia Type:General  Level of Consciousness: awake, alert , oriented and patient cooperative  Airway & Oxygen Therapy: Patient Spontanous Breathing and Patient connected to face mask oxygen  Post-op Assessment: Report given to RN and Post -op Vital signs reviewed and stable  Post vital signs: Reviewed and stable  Last Vitals:  Vitals Value Taken Time  BP 147/91 05/23/19 2041  Temp 36.8 C 05/23/19 2041  Pulse 103 05/23/19 2043  Resp 22 05/23/19 2043  SpO2 100 % 05/23/19 2043  Vitals shown include unvalidated device data.  Last Pain:  Vitals:   05/23/19 1718  TempSrc:   PainSc: 2          Complications: No apparent anesthesia complications

## 2019-05-23 NOTE — Anesthesia Preprocedure Evaluation (Signed)
Anesthesia Evaluation  Patient identified by MRN, date of birth, ID band Patient awake    Reviewed: Allergy & Precautions, NPO status , Patient's Chart, lab work & pertinent test results, reviewed documented beta blocker date and time   Airway Mallampati: III  TM Distance: >3 FB     Dental  (+) Chipped   Pulmonary asthma ,           Cardiovascular hypertension, Pt. on medications      Neuro/Psych PSYCHIATRIC DISORDERS Depression    GI/Hepatic   Endo/Other  diabetes, Type 2  Renal/GU Renal disease     Musculoskeletal   Abdominal   Peds  Hematology   Anesthesia Other Findings Obese. Gout.  Reproductive/Obstetrics                             Anesthesia Physical Anesthesia Plan  ASA: III  Anesthesia Plan: General   Post-op Pain Management:    Induction: Intravenous  PONV Risk Score and Plan:   Airway Management Planned: Oral ETT  Additional Equipment:   Intra-op Plan:   Post-operative Plan:   Informed Consent: I have reviewed the patients History and Physical, chart, labs and discussed the procedure including the risks, benefits and alternatives for the proposed anesthesia with the patient or authorized representative who has indicated his/her understanding and acceptance.       Plan Discussed with: CRNA  Anesthesia Plan Comments:         Anesthesia Quick Evaluation

## 2019-05-23 NOTE — H&P (Signed)
Urology Consult/ H&P  I have been asked to see the patient by Dr. Fanny BienQuale, for evaluation and management of right flank pain/right ureteral stone/right hydronephrosis.  Chief Complaint: Right flank pain  History of Present Illness: [name] is a 33 y.o. year old a personal history of nephrolithiasis presenting earlier today to urgent care later transferred to the emergency room with fevers to 10 104, tachycardia, leukocytosis to 14, and a positive urinalysis all concerning for kidney stone.  She underwent a CT abdomen pelvis with contrast revealing a large greater than 1 cm right distal ureteral calculus with severe hydroureteronephrosis down to the level.  She continues to have significant pain.  This been going on since Sunday.  She has had associated nausea and vomiting.    No gross hematuria.  No dysuria.  She does have a personal history of kidney stones.  She supposedly passed a 10 mm right UPJ stone in 2016 spontaneously.  She has have multiple medical comorbidities including diabetes as listed below.  Past Medical History:  Diagnosis Date  . Asthma   . Depression   . Diabetes mellitus without complication (HCC)   . Gout   . Hypertension   . Kidney stone     Past Surgical History:  Procedure Laterality Date  . ANKLE SURGERY    . TONSILLECTOMY      Home Medications:  Allopurinol 300 mg daily Metformin 500 mg twice daily Venlafaxine 37.5 mg daily  Allergies: No Known Allergies  No family history on file.  Social History:  reports that she has never smoked. She has never used smokeless tobacco. She reports that she does not drink alcohol or use drugs.  ROS: A complete review of systems was performed.  All systems are negative except for pertinent findings as noted.  Physical Exam:  Vital signs in last 24 hours: Temp:  [101.1 F (38.4 C)-103.2 F (39.6 C)] 101.1 F (38.4 C) (08/05 1710) Pulse Rate:  [107-120] 107 (08/05 1730) Resp:  [14-20] 14  (08/05 1552) BP: (130-150)/(79-87) 141/79 (08/05 1730) SpO2:  [95 %-100 %] 95 % (08/05 1730) Weight:  [94.8 kg] 94.8 kg (08/05 1553) Constitutional:  Alert and oriented, No acute distress Endo: Facial hirsutism HEENT: [place] AT, moist mucus membranes.  Trachea midline, no masses Cardiovascular: Regular rate and rhythm, no clubbing, cyanosis, or edema. Respiratory: Normal respiratory effort, lungs clear bilaterally GI: Abdomen is soft, nontender, nondistended, no abdominal masses, obese GU: Mild right CVA tenderness Skin: No rashes, bruises or suspicious lesions Neurologic: Grossly intact, no focal deficits, moving all 4 extremities Psychiatric: Normal mood and affect   Laboratory Data:  Recent Labs    05/23/19 1414  WBC 14.8*  HGB 10.8*  HCT 32.4*   Recent Labs    05/23/19 1414  NA 129*  K 3.6  CL 99  CO2 18*  GLUCOSE 107*  BUN 14  CREATININE 1.08*  CALCIUM 8.3*   Component     Latest Ref Rng & Units 05/23/2019  Color, Urine     YELLOW YELLOW  Appearance     CLEAR CLOUDY (A)  Specific Gravity, Urine     1.005 - 1.030 1.020  pH     5.0 - 8.0 6.0  Glucose, UA     NEGATIVE mg/dL NEGATIVE  Hgb urine dipstick     NEGATIVE LARGE (A)  Bilirubin Urine     NEGATIVE SMALL (A)  Ketones, ur     NEGATIVE mg/dL NEGATIVE  Protein  NEGATIVE mg/dL 100 (A)  Nitrite     NEGATIVE NEGATIVE  Leukocytes,Ua     NEGATIVE SMALL (A)  Squamous Epithelial / LPF     0 - 5 6-10  WBC, UA     0 - 5 WBC/hpf >50  RBC / HPF     0 - 5 RBC/hpf 0-5  Bacteria, UA     NONE SEEN MANY (A)    Radiologic Imaging: CLINICAL DATA:  Right-sided flank pain, nausea, vomiting and fever.  EXAM: CT ABDOMEN AND PELVIS WITH CONTRAST  TECHNIQUE: Multidetector CT imaging of the abdomen and pelvis was performed using the standard protocol following bolus administration of intravenous contrast.  CONTRAST:  148mL OMNIPAQUE IOHEXOL 300 MG/ML  SOLN  COMPARISON:  CT of the abdomen and pelvis  without contrast on 07/24/2015  FINDINGS: Lower chest: No acute abnormality.  Hepatobiliary: No focal liver abnormality is seen. No gallstones, gallbladder wall thickening, or biliary dilatation.  Pancreas: Unremarkable. No pancreatic ductal dilatation or surrounding inflammatory changes.  Spleen: Normal in size without focal abnormality.  Adrenals/Urinary Tract: Adrenal glands are unremarkable. Severe right-sided hydronephrosis and hydroureter present secondary to a calculus located in the distal ureter just above the ureterovesical junction and measuring approximately 9 x 11 x 12 mm. No additional calculi identified. No evidence of left-sided hydronephrosis. The bladder is unremarkable.  Stomach/Bowel: Bowel shows no evidence of obstruction, ileus, inflammation or lesion. No free air identified.  Vascular/Lymphatic: There are some scattered small upper to mid abdominal retroperitoneal lymph nodes not seen previously. The largest is in anterior aortocaval node measuring 14 mm in short axis.  Reproductive: Probable right-sided uterine fibroid. Cyst along the medial aspect of the right ovary is likely physiologic in measures roughly 2.7 cm.  Other: No abdominal wall hernia or abnormality. No abdominopelvic ascites.  Musculoskeletal: No acute or significant osseous findings.  IMPRESSION: 1. Severe right-sided hydronephrosis and hydroureter secondary to an obstructing calculus in the distal ureter nearly at the ureterovesical junction and measuring 12 mm in greatest diameter. 2. New nonspecific upper to mid abdominal retroperitoneal lymph nodes which are small to mildly enlarged in size with the largest measuring 14 mm in short axis. These have enlarged since the prior CT.   Electronically Signed   By: Aletta Edouard M.D.   On: 05/23/2019 18:41  CT scan was personally reviewed.  Agree with radiologic interpretation.  Impression/Assessment:   33 year old female with large 58mm obstructing right distal ureteral calculus with signs and symptoms of sepsis including high fever to 104, tachycardia, leukocytosis, positive urinalysis.  I recommended proceeding emergently to the operating room for cystoscopy, attempted right ureteral stent placement.  Given the size of the stone, there is a risk that we may have some difficulty and if this fails, she will need a right percutaneous nephrostomy tube.  Risks and benefits of his stent were discussed in detail clear risk of bleeding, infection, damage surrounding structures, risk of failure of stent placement, risk of temporary worsening of sepsis, amongst others.  All questions were answered.  She understands that she will need a staged procedure for definitive management of her stone down the road once her infection is cleared.  She received IV ceftriaxone at 16: 49, 1 g  Plan:  -2 OR for attempted right ureteral stent placement -Consent reviewed with patient personally, site marked -Plan to broaden antibiotics perioperatively with extended dose of gentamicin -Admit postop for IV antibiotics and continued supportive care -Home medicines  -Covid testing pending  05/23/2019, 6:23  PM  Vanna ScotlandAshley Lyndsee Casa,  MD

## 2019-05-23 NOTE — ED Notes (Signed)
Dr. Quale at bedside.  

## 2019-05-23 NOTE — Anesthesia Postprocedure Evaluation (Signed)
Anesthesia Post Note  Patient: Charlotte Holloway  Procedure(s) Performed: CYSTOSCOPY WITH STENT PLACEMENT (Right )  Patient location during evaluation: PACU Anesthesia Type: General Level of consciousness: awake and alert Pain management: pain level controlled Vital Signs Assessment: post-procedure vital signs reviewed and stable Respiratory status: spontaneous breathing, nonlabored ventilation, respiratory function stable and patient connected to nasal cannula oxygen Cardiovascular status: blood pressure returned to baseline and stable Postop Assessment: no apparent nausea or vomiting Anesthetic complications: no     Last Vitals:  Vitals:   05/23/19 2226 05/23/19 2236  BP: 132/81 120/76  Pulse: (!) 127 (!) 128  Resp: 20 18  Temp: (!) 38.5 C   SpO2: 97% 96%    Last Pain:  Vitals:   05/23/19 2236  TempSrc:   PainSc: 0-No pain                 Burton Gahan S

## 2019-05-23 NOTE — ED Notes (Signed)
Patient transported to CT 

## 2019-05-23 NOTE — Anesthesia Procedure Notes (Signed)
Procedure Name: Intubation Date/Time: 05/23/2019 7:50 PM Performed by: Lendon Colonel, CRNA Pre-anesthesia Checklist: Patient identified, Patient being monitored, Timeout performed, Emergency Drugs available and Suction available Patient Re-evaluated:Patient Re-evaluated prior to induction Oxygen Delivery Method: Circle system utilized Preoxygenation: Pre-oxygenation with 100% oxygen Induction Type: IV induction Ventilation: Mask ventilation without difficulty Laryngoscope Size: 3 and McGraph Grade View: Grade I Tube type: Oral Tube size: 7.0 mm Number of attempts: 1 Airway Equipment and Method: Stylet Placement Confirmation: ETT inserted through vocal cords under direct vision,  positive ETCO2 and breath sounds checked- equal and bilateral Secured at: 21 cm Tube secured with: Tape Dental Injury: Teeth and Oropharynx as per pre-operative assessment

## 2019-05-23 NOTE — ED Notes (Addendum)
Pt resting in bed with no complaints at this time.

## 2019-05-24 ENCOUNTER — Encounter: Payer: Self-pay | Admitting: Urology

## 2019-05-24 DIAGNOSIS — N201 Calculus of ureter: Secondary | ICD-10-CM | POA: Diagnosis present

## 2019-05-24 DIAGNOSIS — Z6836 Body mass index (BMI) 36.0-36.9, adult: Secondary | ICD-10-CM | POA: Diagnosis not present

## 2019-05-24 DIAGNOSIS — I10 Essential (primary) hypertension: Secondary | ICD-10-CM | POA: Diagnosis present

## 2019-05-24 DIAGNOSIS — J45909 Unspecified asthma, uncomplicated: Secondary | ICD-10-CM | POA: Diagnosis present

## 2019-05-24 DIAGNOSIS — B9620 Unspecified Escherichia coli [E. coli] as the cause of diseases classified elsewhere: Secondary | ICD-10-CM | POA: Diagnosis present

## 2019-05-24 DIAGNOSIS — E669 Obesity, unspecified: Secondary | ICD-10-CM | POA: Diagnosis present

## 2019-05-24 DIAGNOSIS — Z79899 Other long term (current) drug therapy: Secondary | ICD-10-CM | POA: Diagnosis not present

## 2019-05-24 DIAGNOSIS — Z7984 Long term (current) use of oral hypoglycemic drugs: Secondary | ICD-10-CM | POA: Diagnosis not present

## 2019-05-24 DIAGNOSIS — E119 Type 2 diabetes mellitus without complications: Secondary | ICD-10-CM | POA: Diagnosis present

## 2019-05-24 DIAGNOSIS — N136 Pyonephrosis: Secondary | ICD-10-CM | POA: Diagnosis present

## 2019-05-24 DIAGNOSIS — A4151 Sepsis due to Escherichia coli [E. coli]: Secondary | ICD-10-CM | POA: Diagnosis present

## 2019-05-24 DIAGNOSIS — F329 Major depressive disorder, single episode, unspecified: Secondary | ICD-10-CM | POA: Diagnosis present

## 2019-05-24 DIAGNOSIS — Z87442 Personal history of urinary calculi: Secondary | ICD-10-CM | POA: Diagnosis not present

## 2019-05-24 DIAGNOSIS — M109 Gout, unspecified: Secondary | ICD-10-CM | POA: Diagnosis present

## 2019-05-24 DIAGNOSIS — Z20828 Contact with and (suspected) exposure to other viral communicable diseases: Secondary | ICD-10-CM | POA: Diagnosis present

## 2019-05-24 LAB — NOVEL CORONAVIRUS, NAA (HOSP ORDER, SEND-OUT TO REF LAB; TAT 18-24 HRS): SARS-CoV-2, NAA: NOT DETECTED

## 2019-05-24 LAB — BASIC METABOLIC PANEL
Anion gap: 12 (ref 5–15)
BUN: 12 mg/dL (ref 6–20)
CO2: 22 mmol/L (ref 22–32)
Calcium: 7.7 mg/dL — ABNORMAL LOW (ref 8.9–10.3)
Chloride: 105 mmol/L (ref 98–111)
Creatinine, Ser: 1.11 mg/dL — ABNORMAL HIGH (ref 0.44–1.00)
GFR calc Af Amer: >60 mL/min (ref >60)
GFR calc non Af Amer: >60 mL/min (ref >60)
Glucose, Bld: 109 mg/dL — ABNORMAL HIGH (ref 70–99)
Potassium: 4 mmol/L (ref 3.5–5.1)
Sodium: 139 mmol/L (ref 135–145)

## 2019-05-24 LAB — BLOOD CULTURE ID PANEL (REFLEXED)

## 2019-05-24 LAB — CBC
HCT: 30.5 % — ABNORMAL LOW (ref 36.0–46.0)
Hemoglobin: 9.6 g/dL — ABNORMAL LOW (ref 12.0–15.0)
MCH: 27.8 pg (ref 26.0–34.0)
MCHC: 31.5 g/dL (ref 30.0–36.0)
MCV: 88.4 fL (ref 80.0–100.0)
Platelets: 349 10*3/uL (ref 150–400)
RBC: 3.45 MIL/uL — ABNORMAL LOW (ref 3.87–5.11)
RDW: 15.9 % — ABNORMAL HIGH (ref 11.5–15.5)
WBC: 11.9 10*3/uL — ABNORMAL HIGH (ref 4.0–10.5)
nRBC: 0 % (ref 0.0–0.2)

## 2019-05-24 LAB — GLUCOSE, CAPILLARY
Glucose-Capillary: 102 mg/dL — ABNORMAL HIGH (ref 70–99)
Glucose-Capillary: 102 mg/dL — ABNORMAL HIGH (ref 70–99)
Glucose-Capillary: 132 mg/dL — ABNORMAL HIGH (ref 70–99)
Glucose-Capillary: 71 mg/dL (ref 70–99)
Glucose-Capillary: 80 mg/dL (ref 70–99)

## 2019-05-24 LAB — GENTAMICIN LEVEL, RANDOM: Gentamicin Rm: 2.9 ug/mL

## 2019-05-24 MED ORDER — DIPHENHYDRAMINE HCL 12.5 MG/5ML PO ELIX
12.5000 mg | ORAL_SOLUTION | Freq: Four times a day (QID) | ORAL | Status: DC | PRN
  Filled 2019-05-24: qty 5

## 2019-05-24 MED ORDER — SODIUM CHLORIDE 0.9 % IV SOLN: 1.0000 g | INTRAVENOUS | Status: DC

## 2019-05-24 MED ORDER — INSULIN ASPART 100 UNIT/ML ~~LOC~~ SOLN: 0.0000 [IU] | Freq: Every day | SUBCUTANEOUS | Status: DC

## 2019-05-24 MED ORDER — MORPHINE SULFATE (PF) 2 MG/ML IV SOLN: 2.0000 mg | INTRAVENOUS | Status: DC | PRN

## 2019-05-24 MED ORDER — OXYBUTYNIN CHLORIDE 5 MG PO TABS: 5.0000 mg | ORAL_TABLET | Freq: Three times a day (TID) | ORAL | Status: DC | PRN

## 2019-05-24 MED ORDER — OXYCODONE-ACETAMINOPHEN 5-325 MG PO TABS: 1.0000 | ORAL_TABLET | ORAL | Status: DC | PRN

## 2019-05-24 MED ORDER — TAMSULOSIN HCL 0.4 MG PO CAPS
0.4000 mg | ORAL_CAPSULE | Freq: Every day | ORAL | Status: DC
  Administered 2019-05-24 – 2019-05-25 (×2): 0.4 mg via ORAL
  Filled 2019-05-24 (×2): qty 1

## 2019-05-24 MED ORDER — METFORMIN HCL 500 MG PO TABS
500.0000 mg | ORAL_TABLET | Freq: Two times a day (BID) | ORAL | Status: DC
  Administered 2019-05-24 – 2019-05-25 (×3): 500 mg via ORAL
  Filled 2019-05-24 (×3): qty 1

## 2019-05-24 MED ORDER — ACETAMINOPHEN 325 MG PO TABS
650.0000 mg | ORAL_TABLET | ORAL | Status: DC | PRN
  Administered 2019-05-24 – 2019-05-25 (×3): 650 mg via ORAL
  Filled 2019-05-24 (×3): qty 2

## 2019-05-24 MED ORDER — SODIUM CHLORIDE 0.9 % IV SOLN
INTRAVENOUS | Status: DC
  Administered 2019-05-24 – 2019-05-25 (×2): via INTRAVENOUS

## 2019-05-24 MED ORDER — INSULIN ASPART 100 UNIT/ML ~~LOC~~ SOLN: 0.0000 [IU] | Freq: Three times a day (TID) | SUBCUTANEOUS | Status: DC

## 2019-05-24 MED ORDER — DOCUSATE SODIUM 100 MG PO CAPS
100.0000 mg | ORAL_CAPSULE | Freq: Two times a day (BID) | ORAL | Status: DC
  Administered 2019-05-24: 09:00:00 100 mg via ORAL
  Filled 2019-05-24 (×2): qty 1

## 2019-05-24 MED ORDER — DIPHENHYDRAMINE HCL 50 MG/ML IJ SOLN: 12.5000 mg | Freq: Four times a day (QID) | INTRAMUSCULAR | Status: DC | PRN

## 2019-05-24 MED ORDER — SODIUM CHLORIDE 0.9 % IV SOLN
2.0000 g | INTRAVENOUS | Status: DC
  Administered 2019-05-24: 12:00:00 2 g via INTRAVENOUS
  Filled 2019-05-24: qty 2
  Filled 2019-05-24: qty 20

## 2019-05-24 MED ORDER — HEPARIN SODIUM (PORCINE) 5000 UNIT/ML IJ SOLN
5000.0000 [IU] | Freq: Three times a day (TID) | INTRAMUSCULAR | Status: DC
  Administered 2019-05-24 – 2019-05-25 (×4): 5000 [IU] via SUBCUTANEOUS
  Filled 2019-05-24 (×4): qty 1

## 2019-05-24 MED ORDER — BELLADONNA ALKALOIDS-OPIUM 16.2-60 MG RE SUPP: 1.0000 | Freq: Four times a day (QID) | RECTAL | Status: DC | PRN

## 2019-05-24 MED ORDER — ONDANSETRON HCL 4 MG/2ML IJ SOLN: 4.0000 mg | INTRAMUSCULAR | Status: DC | PRN

## 2019-05-24 NOTE — Progress Notes (Signed)
PHARMACY NOTE:  ANTIMICROBIAL  DOSAGE ADJUSTMENT  Current antimicrobial regimen includes a mismatch between antimicrobial dosage and indication.  As per policy approved by the Pharmacy & Therapeutics and Medical Executive Committees, the antimicrobial dosage will be adjusted accordingly.  Current antimicrobial dosage:  Ceftriaxone 1 gram IV every 24 hours  Indication: bacteremia/UTI  Renal Function:  Estimated Creatinine Clearance: 81.1 mL/min (A) (by C-G formula based on SCr of 1.11 mg/dL (H)). []      On intermittent HD, scheduled: []      On CRRT    Antimicrobial dosage has been changed to:   Ceftriaxone 2 grams IV every 24 hours   Thank you for allowing pharmacy to be a part of this patient's care.  Dallie Piles, [facility] 05/24/2019 7:15 AM

## 2019-05-24 NOTE — Progress Notes (Signed)
Urology Inpatient Progress Note  Subjective: Charlotte Holloway is a 33 y.o. female who is POD1 from emergent right cystoscopy with stent placement for 97mm right distal obstructing ureteral stone with infected hydronephrosis and concern for sepsis.  Today she reports shivering since she awoke from surgery. She denies pain and states she has not experienced nausea or vomiting since prior to her admission. Foley in place draining clear golden yellow urine.  WBC 11.9, down from 14.8 yesterday. Creatinine stable at 1.11. Patient is afebrile and normotensive with normalized HR.  Blood cultures with E coli, urine culture pending.  Anti-infectives: Anti-infectives (From admission, onward)   Start     Dose/Rate Route Frequency Ordered Stop   05/24/19 2000  gentamicin (GARAMYCIN) 470 mg in dextrose 5 % 100 mL IVPB     5 mg/kg  94.8 kg 111.8 mL/hr over 60 Minutes Intravenous Every 24 hours 05/23/19 1901     05/24/19 1600  cefTRIAXone (ROCEPHIN) 1 g in sodium chloride 0.9 % 100 mL IVPB  Status:  Discontinued     1 g 200 mL/hr over 30 Minutes Intravenous Every 24 hours 05/24/19 0008 05/24/19 0715   05/24/19 1200  cefTRIAXone (ROCEPHIN) 2 g in sodium chloride 0.9 % 100 mL IVPB     2 g 200 mL/hr over 30 Minutes Intravenous Every 24 hours 05/24/19 0715     05/23/19 1900  gentamicin (GARAMYCIN) 470 mg in dextrose 5 % 100 mL IVPB  Status:  Discontinued     5 mg/kg  94.8 kg 111.8 mL/hr over 60 Minutes Intravenous Every 24 hours 05/23/19 1855 05/23/19 1901   05/23/19 1615  cefTRIAXone (ROCEPHIN) 1 g in sodium chloride 0.9 % 100 mL IVPB     1 g 200 mL/hr over 30 Minutes Intravenous  Once 05/23/19 1606 05/23/19 1810     Current Facility-Administered Medications  Medication Dose Route Frequency Provider Last Rate Last Dose  . 0.9 %  sodium chloride infusion   Intravenous Continuous Hollice Espy, MD 125 mL/hr at 05/24/19 [phone]    . acetaminophen (TYLENOL) tablet 650 mg  650 mg Oral Q4H PRN Hollice Espy, MD   650 mg at 05/24/19 0901  . cefTRIAXone (ROCEPHIN) 2 g in sodium chloride 0.9 % 100 mL IVPB  2 g Intravenous Q24H Dallie Piles, RPH      . diphenhydrAMINE (BENADRYL) injection 12.5 mg  12.5 mg Intravenous Q6H PRN Hollice Espy, MD       Or  . diphenhydrAMINE (BENADRYL) 12.5 MG/5ML elixir 12.5 mg  12.5 mg Oral Q6H PRN Hollice Espy, MD      . docusate sodium (COLACE) capsule 100 mg  100 mg Oral BID Hollice Espy, MD   100 mg at 05/24/19 0901  . gentamicin (GARAMYCIN) 470 mg in dextrose 5 % 100 mL IVPB  5 mg/kg Intravenous Q24H Delman Kitten, MD   Stopped at 05/24/19 0710  . heparin injection 5,000 Units  5,000 Units Subcutaneous Q8H Hollice Espy, MD   5,000 Units at 05/24/19 0505  . insulin aspart (novoLOG) injection 0-15 Units  0-15 Units Subcutaneous TID WC Hollice Espy, MD      . insulin aspart (novoLOG) injection 0-5 Units  0-5 Units Subcutaneous QHS Hollice Espy, MD      . metFORMIN (GLUCOPHAGE) tablet 500 mg  500 mg Oral BID WC Hollice Espy, MD   500 mg at 05/24/19 0901  . morphine 2 MG/ML injection 2-4 mg  2-4 mg Intravenous Q2H PRN Hollice Espy, MD      .  ondansetron (ZOFRAN) injection 4 mg  4 mg Intravenous Q4H PRN Vanna ScotlandBrandon, Ashley, MD      . opium-belladonna (B&O SUPPRETTES) 16.2-60 MG suppository 1 suppository  1 suppository Rectal Q6H PRN Vanna ScotlandBrandon, Ashley, MD      . oxybutynin (DITROPAN) tablet 5 mg  5 mg Oral Q8H PRN Vanna ScotlandBrandon, Ashley, MD      . oxyCODONE-acetaminophen (PERCOCET/ROXICET) 5-325 MG per tablet 1-2 tablet  1-2 tablet Oral Q4H PRN Vanna ScotlandBrandon, Ashley, MD      . tamsulosin [facility](FLOMAX) capsule 0.4 mg  0.4 mg Oral Daily Vanna ScotlandBrandon, Ashley, MD   0.4 mg at 05/24/19 0901   Objective: Vital signs in last 24 hours: Temp:  [98 F (36.7 C)-103.2 F (39.6 C)] 98.8 F (37.1 C) (08/06 0617) Pulse Rate:  [87-154] 87 (08/06 0617) Resp:  [14-32] 20 (08/06 0617) BP: (105-227)/(57-99) 107/67 (08/06 0617) SpO2:  [94 %-100 %] 99 % (08/06 0617) Weight:  [94.8  kg-97.6 kg] 97.6 kg (08/06 0115)  Intake/Output from previous day: 08/05 0701 - 08/06 0700 In: 1815.8 [P.O.:240; I.V.:1475.8; IV Piggyback:100] Out: 1800 [Urine:1800] Intake/Output this shift: No intake/output data recorded.  Physical Exam Vitals signs and nursing note reviewed.  Constitutional:      General: She is not in acute distress.    Appearance: She is not ill-appearing, toxic-appearing or diaphoretic.  HENT:     Head: Normocephalic and atraumatic.  Pulmonary:     Effort: Pulmonary effort is normal. No respiratory distress.  Abdominal:     General: There is no distension.     Palpations: Abdomen is soft.     Tenderness: There is no abdominal tenderness. There is no right CVA tenderness or left CVA tenderness.  Skin:    General: Skin is warm and dry.  Neurological:     Mental Status: She is alert and oriented to person, place, and time.  Psychiatric:        Mood and Affect: Mood normal.        Behavior: Behavior normal.   Lab Results:  Recent Labs    05/23/19 1414 05/24/19 0607  WBC 14.8* 11.9*  HGB 10.8* 9.6*  HCT 32.4* 30.5*  PLT 406* 349   BMET Recent Labs    05/23/19 1414 05/24/19 0607  NA 129* 139  K 3.6 4.0  CL 99 105  CO2 18* 22  GLUCOSE 107* 109*  BUN 14 12  CREATININE 1.08* 1.11*  CALCIUM 8.3* 7.7*   Studies/Results: Ct Abdomen Pelvis W Contrast  Result Date: 05/23/2019 CLINICAL DATA:  Right-sided flank pain, nausea, vomiting and fever. EXAM: CT ABDOMEN AND PELVIS WITH CONTRAST TECHNIQUE: Multidetector CT imaging of the abdomen and pelvis was performed using the standard protocol following bolus administration of intravenous contrast. CONTRAST:  100mL OMNIPAQUE IOHEXOL 300 MG/ML  SOLN COMPARISON:  CT of the abdomen and pelvis without contrast on 07/24/2015 FINDINGS: Lower chest: No acute abnormality. Hepatobiliary: No focal liver abnormality is seen. No gallstones, gallbladder wall thickening, or biliary dilatation. Pancreas: Unremarkable. No  pancreatic ductal dilatation or surrounding inflammatory changes. Spleen: Normal in size without focal abnormality. Adrenals/Urinary Tract: Adrenal glands are unremarkable. Severe right-sided hydronephrosis and hydroureter present secondary to a calculus located in the distal ureter just above the ureterovesical junction and measuring approximately 9 x 11 x 12 mm. No additional calculi identified. No evidence of left-sided hydronephrosis. The bladder is unremarkable. Stomach/Bowel: Bowel shows no evidence of obstruction, ileus, inflammation or lesion. No free air identified. Vascular/Lymphatic: There are some scattered small upper to mid abdominal  retroperitoneal lymph nodes not seen previously. The largest is in anterior aortocaval node measuring 14 mm in short axis. Reproductive: Probable right-sided uterine fibroid. Cyst along the medial aspect of the right ovary is likely physiologic in measures roughly 2.7 cm. Other: No abdominal wall hernia or abnormality. No abdominopelvic ascites. Musculoskeletal: No acute or significant osseous findings. IMPRESSION: 1. Severe right-sided hydronephrosis and hydroureter secondary to an obstructing calculus in the distal ureter nearly at the ureterovesical junction and measuring 12 mm in greatest diameter. 2. New nonspecific upper to mid abdominal retroperitoneal lymph nodes which are small to mildly enlarged in size with the largest measuring 14 mm in short axis. These have enlarged since the prior CT. Electronically Signed   By: Irish LackGlenn  Yamagata M.D.   On: 05/23/2019 18:41   I personally reviewed the above imaging and agree with the radiologic findings.  Assessment & Plan: Patient clinically improving and on appropriate antibiotics per blood cultures; urine cultures still pending. Can D/C Foley today. Anticipate discharge tomorrow on PO antibiotics if she remains afebrile and with adequate pain control.  She will require outpatient follow up in our clinic in 2 weeks to  discuss definitive stone management.  I counseled the patient that stents can cause pain in the flank or groin and/or gross hematuria. I informed her that she may expect to experience these symptoms for the duration of the stent being in place. I counseled her that she should follow up with us urgently if she develops new fever, chills, nausea, vomiting, or the inability to urinate before it is removed, as these are not typical symptoms associated with a stent. She expressed understanding of this plan.  -Continue antibiotics -D/C Foley -Continue to monitor overnight  Carman ChingSamantha Joshva Labreck, PA-C 05/24/2019

## 2019-05-24 NOTE — Progress Notes (Addendum)
PHARMACY - PHYSICIAN COMMUNICATION CRITICAL VALUE ALERT - BLOOD CULTURE IDENTIFICATION (BCID)  Charlotte Holloway is an 33 y.o. female who presented to [facility] on 05/23/2019 with a chief complaint of flank pain and fever.  Assessment:  Admitted w/ sepsis and kidney stone.  S/P cystoscopy w/ stent placement.  BCID + for E Coli  Name of physician (or Provider) Contacted: Dr Hollice Espy  Current antibiotics: CTX and Gent.  Changes to prescribed antibiotics recommended:  Recommendations declined by provider.  She would like to continue current ABX.  MD will follow up.    Results for orders placed or performed during the hospital encounter of 05/23/19  Blood Culture ID Panel (Reflexed) (Collected: 05/23/2019  4:41 PM)  Result Value Ref Range   Enterococcus species NOT DETECTED NOT DETECTED   Listeria monocytogenes NOT DETECTED NOT DETECTED   Staphylococcus species NOT DETECTED NOT DETECTED   Staphylococcus aureus (BCID) NOT DETECTED NOT DETECTED   Streptococcus species NOT DETECTED NOT DETECTED   Streptococcus agalactiae NOT DETECTED NOT DETECTED   Streptococcus pneumoniae NOT DETECTED NOT DETECTED   Streptococcus pyogenes NOT DETECTED NOT DETECTED   Acinetobacter baumannii NOT DETECTED NOT DETECTED   Enterobacteriaceae species DETECTED (A) NOT DETECTED   Enterobacter cloacae complex NOT DETECTED NOT DETECTED   Escherichia coli DETECTED (A) NOT DETECTED   Klebsiella oxytoca NOT DETECTED NOT DETECTED   Klebsiella pneumoniae NOT DETECTED NOT DETECTED   Proteus species NOT DETECTED NOT DETECTED   Serratia marcescens NOT DETECTED NOT DETECTED   Carbapenem resistance NOT DETECTED NOT DETECTED   Haemophilus influenzae NOT DETECTED NOT DETECTED   Neisseria meningitidis NOT DETECTED NOT DETECTED   Pseudomonas aeruginosa NOT DETECTED NOT DETECTED   Candida albicans NOT DETECTED NOT DETECTED   Candida glabrata NOT DETECTED NOT DETECTED   Candida krusei NOT DETECTED NOT DETECTED   Candida  parapsilosis NOT DETECTED NOT DETECTED   Candida tropicalis NOT DETECTED NOT DETECTED    Hart Robinsons A 05/24/2019  4:54 AM

## 2019-05-24 NOTE — Op Note (Signed)
Date of procedure: 05/23/2019  Preoperative diagnosis:  1. Right distal ureter calculus 2. Right hydroureteronephrosis 3. Sepsis due to urinary source  Postoperative diagnosis:  1. Same as above  Procedure: 1. Cystoscopy 2. Right retrograde pyelogram 3. Right ureteral stent placement  Surgeon: Hollice Espy, MD  Anesthesia: General  Complications: None  Intraoperative findings: Large filling defect in the right distal ureter consistent with known stone which was also seen on scout.  Stent placed easily.  Diffuse nonspecific cystitis appreciated.  EBL: Minimal  Drains: 6 x 24 French double-J ureteral stent on right, 16 French Foley catheter   Indication: Charlotte Holloway is a 33 y.o. patient with 12 mm right distal obstructing ureteral calculus with severe hydroureteronephrosis and evidence of sepsis with high-grade, leukocytosis, tachycardia.  After reviewing the management options for treatment, she elected to proceed with the above surgical procedure(s). We have discussed the potential benefits and risks of the procedure, side effects of the proposed treatment, the likelihood of the patient achieving the goals of the procedure, and any potential problems that might occur during the procedure or recuperation. Informed consent has been obtained.  Description of procedure:  The patient was taken to the operating room and general anesthesia was induced.  The patient was placed in the dorsal lithotomy position, prepped and draped in the usual sterile fashion, and preoperative antibiotics were administered. A preoperative time-out was performed.   21 [place] scope was advanced per urethra to the bladder.  Notably there was diffuse cystitis appreciated at the bladder with somewhat cloudy urine.  Attention was turned to the right ureteral orifice.  On scout imaging, the distal right ureteral stone could be easily identified.  I then intubated the right UO using a 5 [place] open-ended  ureteral catheter and a small amount of contrast was injected which remove the stone somewhat more proximally and created a filling defect at this level.  I then placed a sensor up to level the kidney around the stone with minimal difficulty.  Identified [place] open-ended ureteral catheter up to level the proximal ureter where a hydronephrotic drip was appreciated when the wire was removed.  Addition to that another small amount of contrast in the upper tract outline the renal pelvis and calyces for the purpose of stent positioning.  The wire was then replaced and the open-ended ureteral catheter was removed.  A 6 x 24 French double-J ureteral stent was then advanced over the wire up to level the kidney.  The wire was partially drawn till full coils noted both within the renal pelvis as well as within the bladder.  The bladder was then drained.  There is no significant reflux of purulent material but the urine was relatively cloudy.  The bladder was then drained and the scope was removed.  A 16 French Foley catheter was then placed with a focus of maximal urinary drainage from the balloon with 10 cc of sterile water.  Patient was then clean and dry, repositioned from the supine position, reversed her anesthesia and once her negative COVID test returned, was taken to the PACU in stable condition.  There were no complications in this case.  Plan: Patient will be admitted overnight for IV antibiotics and supportive care.  Will likely remove her Foley in the morning if she improves.  Hollice Espy, M.D.

## 2019-05-24 NOTE — Plan of Care (Signed)

## 2019-05-25 ENCOUNTER — Other Ambulatory Visit: Payer: Self-pay | Admitting: Radiology

## 2019-05-25 ENCOUNTER — Telehealth: Payer: Self-pay | Admitting: Physician Assistant

## 2019-05-25 DIAGNOSIS — N201 Calculus of ureter: Secondary | ICD-10-CM

## 2019-05-25 LAB — URINE CULTURE
Culture: >=100000 — AB
Special Requests: NORMAL

## 2019-05-25 LAB — HIV ANTIBODY (ROUTINE TESTING W REFLEX): HIV Screen 4th Generation wRfx: NONREACTIVE

## 2019-05-25 LAB — GLUCOSE, CAPILLARY: Glucose-Capillary: 95 mg/dL (ref 70–99)

## 2019-05-25 MED ORDER — SULFAMETHOXAZOLE-TRIMETHOPRIM 800-160 MG PO TABS: 1.0000 | ORAL_TABLET | Freq: Two times a day (BID) | ORAL | 0 refills | Status: AC

## 2019-05-25 MED ORDER — TAMSULOSIN HCL 0.4 MG PO CAPS: 0.4000 mg | ORAL_CAPSULE | Freq: Every day | ORAL | 0 refills | Status: AC

## 2019-05-25 MED ORDER — OXYBUTYNIN CHLORIDE 5 MG PO TABS: 5.0000 mg | ORAL_TABLET | Freq: Three times a day (TID) | ORAL | 0 refills | Status: AC | PRN

## 2019-05-25 MED ORDER — OXYCODONE-ACETAMINOPHEN 5-325 MG PO TABS: 1.0000 | ORAL_TABLET | ORAL | Status: DC | PRN

## 2019-05-25 MED ORDER — OXYCODONE-ACETAMINOPHEN 5-325 MG PO TABS: 1.0000 | ORAL_TABLET | ORAL | 0 refills | Status: DC | PRN

## 2019-05-25 NOTE — Telephone Encounter (Signed)
Received a voicemail from [employer] stating that the oxycodone that was called in had a flag come up that the dose what to high to dispense. It needed to be changed can someone please call them back?   Thanks, Sharyn Lull

## 2019-05-25 NOTE — Telephone Encounter (Signed)
Handled by Laurence Ferrari, PA

## 2019-05-25 NOTE — Discharge Summary (Signed)
Date of admission: 05/23/2019  Date of discharge: 05/25/2019  Admission diagnosis: Sepsis secondary to right obstructing ureteral stone  Discharge diagnosis: Same as above  Secondary diagnoses:  Patient Active Problem List   Diagnosis Date Noted  . Right ureteral stone 05/23/2019    History and Physical: For full details, please see admission history and physical. Briefly, Charlotte Holloway is a 33 y.o. year old patient admitted on 05/23/2019 for sepsis with right 69m obstructing distal ureteral stone with severe hydroureteronephrosis. She underwent emergent right cystoscopy with stent placement with Dr. BErlene Quanon 05/23/2019. Blood cultures positive for E coli, urine cultures with pan-sensitive E coli. She was treated with IV ceftriaxone for the duration of her admission.  Hospital Course: Patient tolerated the procedure well.  She was then transferred to the floor after an uneventful PACU stay.  Her hospital course was uncomplicated.  On POD2 she had met discharge criteria: was eating a regular diet, was up and ambulating independently,  pain was well controlled, was voiding without a catheter, and was ready for discharge.  Laboratory values:  Recent Labs    05/23/19 1414 05/24/19 0607  WBC 14.8* 11.9*  HGB 10.8* 9.6*  HCT 32.4* 30.5*   Recent Labs    05/23/19 1414 05/24/19 0607  NA 129* 139  K 3.6 4.0  CL 99 105  CO2 18* 22  GLUCOSE 107* 109*  BUN 14 12  CREATININE 1.08* 1.11*  CALCIUM 8.3* 7.7*   Results for orders placed or performed during the hospital encounter of 05/23/19  Culture, blood (Routine X 2) w Reflex to ID Panel     Status: Abnormal (Preliminary result)   Collection Time: 05/23/19  4:41 PM   Specimen: BLOOD  Result Value Ref Range Status   Specimen Description   Final    BLOOD LEFT ANTECUBITAL Performed at [facility] [address] [place] [address]   Special Requests   Final    BOTTLES DRAWN AEROBIC AND ANAEROBIC Blood Culture  results may not be optimal due to an excessive volume of blood received in culture bottles [facility]formed [facility] [address], [place] [address]   Culture  Setup Time   Final    GRAM NEGATIVE RODS ANAEROBIC BOTTLE ONLY CRITICAL RESULT CALLED TO, READ BACK BY AND VERIFIED WITH: [id number]/03/2019 HNM Performed at [facility] [id number], [place] [address]   Culture ESCHERICHIA COLI (A)  Final   Report Status PENDING  Incomplete  Culture, blood (Routine X 2) w Reflex to ID Panel     Status: None (Preliminary result)   Collection Time: 05/23/19  4:41 PM   Specimen: BLOOD  Result Value Ref Range Status   Specimen Description BLOOD RIGHT ANTECUBITAL  Final   Special Requests   Final    BOTTLES DRAWN AEROBIC AND ANAEROBIC Blood Culture adequate volume   Culture   Final    NO GROWTH 2 DAYS [facility]formed at [facility] [address] [place]  [address]   Report Status PENDING  Incomplete  Blood Culture ID Panel (Reflexed)     Status: Abnormal   Collection Time: 05/23/19  4:41 PM  Result Value Ref Range Status   Enterococcus species NOT DETECTED NOT DETECTED Final   Listeria monocytogenes NOT DETECTED NOT DETECTED Final   Staphylococcus species NOT DETECTED NOT DETECTED Final   Staphylococcus aureus (BCID) NOT DETECTED NOT DETECTED Final  Streptococcus species NOT DETECTED NOT DETECTED Final   Streptococcus agalactiae NOT DETECTED NOT DETECTED Final   Streptococcus pneumoniae NOT DETECTED NOT DETECTED Final   Streptococcus pyogenes NOT DETECTED NOT DETECTED Final   Acinetobacter baumannii NOT DETECTED NOT DETECTED Final   Enterobacteriaceae species DETECTED (A) NOT DETECTED Final    Comment: Enterobacteriaceae represent a large family of gram-negative bacteria, not a single organism. CRITICAL RESULT CALLED TO, READ BACK BY AND VERIFIED WITH: SCOTT HALL PHARMD 0445 05/24/2019 HNM    Enterobacter cloacae  complex NOT DETECTED NOT DETECTED Final   Escherichia coli DETECTED (A) NOT DETECTED Final    Comment: CRITICAL RESULT CALLED TO, READ BACK BY AND VERIFIED WITH: Hart Robinsons PHARMD 0445 05/24/2019 HNM    Klebsiella oxytoca NOT DETECTED NOT DETECTED Final   Klebsiella pneumoniae NOT DETECTED NOT DETECTED Final   Proteus species NOT DETECTED NOT DETECTED Final   Serratia marcescens NOT DETECTED NOT DETECTED Final   Carbapenem resistance NOT DETECTED NOT DETECTED Final   Haemophilus influenzae NOT DETECTED NOT DETECTED Final   Neisseria meningitidis NOT DETECTED NOT DETECTED Final   Pseudomonas aeruginosa NOT DETECTED NOT DETECTED Final   Candida albicans NOT DETECTED NOT DETECTED Final   Candida glabrata NOT DETECTED NOT DETECTED Final   Candida krusei NOT DETECTED NOT DETECTED Final   Candida parapsilosis NOT DETECTED NOT DETECTED Final   Candida tropicalis NOT DETECTED NOT DETECTED Final    Comment: Performed at [facility], [address], [place], [place] [address]  Urine Culture     Status: Abnormal   Collection Time: 05/23/19  5:38 PM   Specimen: Urine, Clean Catch  Result Value Ref Range Status   Specimen Description   Final    URINE, CLEAN CATCH Performed at [facility], [address] [place], [place] [address]    Special Requests   Final    Normal Performed at [facility], [address] [place], [place] [address]    Culture >=100,000 COLONIES/mL ESCHERICHIA COLI (A)  Final   Report Status 05/25/2019 FINAL  Final   Organism ID, Bacteria ESCHERICHIA COLI (A)  Final      Susceptibility   Escherichia coli - MIC*    AMPICILLIN <=2 SENSITIVE Sensitive     CEFAZOLIN <=4 SENSITIVE Sensitive     CEFTRIAXONE <=1 SENSITIVE Sensitive     CIPROFLOXACIN <=0.25 SENSITIVE Sensitive     GENTAMICIN <=1 SENSITIVE Sensitive     IMIPENEM <=0.25 SENSITIVE Sensitive     NITROFURANTOIN 32 SENSITIVE Sensitive     TRIMETH/SULFA <=20 SENSITIVE Sensitive      AMPICILLIN/SULBACTAM <=2 SENSITIVE Sensitive     PIP/TAZO <=4 SENSITIVE Sensitive     Extended ESBL NEGATIVE Sensitive     * >=100,000 COLONIES/mL ESCHERICHIA COLI  SARS Coronavirus 2 [facility] order, Performed in [place] hospital lab) Nasopharyngeal Nasopharyngeal Swab     Status: None   Collection Time: 05/23/19  6:47 PM   Specimen: Nasopharyngeal Swab  Result Value Ref Range Status   SARS Coronavirus 2 NEGATIVE NEGATIVE Final    Comment: (NOTE) If result is NEGATIVE SARS-CoV-2 target nucleic acids are NOT DETECTED. The SARS-CoV-2 RNA is generally detectable in upper and lower  respiratory specimens during the acute phase of infection. The lowest  concentration of SARS-CoV-2 viral copies this assay can detect is 250  copies / mL. A negative result does not preclude SARS-CoV-2 infection  and should not be used as the sole basis for treatment or other  patient  management decisions.  A negative result may occur with  improper specimen collection / handling, submission of specimen other  than nasopharyngeal swab, presence of viral mutation(s) within the  areas targeted by this assay, and inadequate number of viral copies  (<250 copies / mL). A negative result must be combined with clinical  observations, patient history, and epidemiological information. If result is POSITIVE SARS-CoV-2 target nucleic acids are DETECTED. The SARS-CoV-2 RNA is generally detectable in upper and lower  respiratory specimens dur ing the acute phase of infection.  Positive  results are indicative of active infection with SARS-CoV-2.  Clinical  correlation with patient history and other diagnostic information is  necessary to determine patient infection status.  Positive results do  not rule out bacterial infection or co-infection with other viruses. If result is PRESUMPTIVE POSTIVE SARS-CoV-2 nucleic acids MAY BE PRESENT.   A presumptive positive result was obtained on the submitted specimen  and  confirmed on repeat testing.  While 2019 novel coronavirus  (SARS-CoV-2) nucleic acids may be present in the submitted sample  additional confirmatory testing may be necessary for epidemiological  and / or clinical management purposes  to differentiate between  SARS-CoV-2 and other Sarbecovirus currently known to infect humans.  If clinically indicated additional testing with an alternate test  methodology [phone]) is advised. The SARS-CoV-2 RNA is generally  detectable in upper and lower respiratory sp ecimens during the acute  phase of infection. The expected result is Negative. Fact Sheet for Patients:  StrictlyIdeas.no Fact Sheet for Healthcare Providers: BankingDealers.co.za This test is not yet approved or cleared by the [place] FDA and has been authorized for detection and/or diagnosis of SARS-CoV-2 by FDA under an Emergency Use Authorization (EUA).  This EUA will remain in effect (meaning this test can be used) for the duration of the COVID-19 declaration under Section 564(b)(1) of the Act, 21 U.S.C. section 360bbb-3(b)(1), unless the authorization is terminated or revoked sooner. Performed at [facility], [address] [place], [place] [address]    Disposition: Home  Discharge instruction: The patient was instructed to be ambulatory but told to refrain from heavy lifting, strenuous activity, or driving. She lifts up to 40 lbs at work and was advised to refrain from this for the duration of her stent being in place.  Discharge medications:  Allergies as of 05/25/2019   No Known Allergies     Medication List    TAKE these medications   allopurinol 300 MG tablet Commonly known as: ZYLOPRIM Take 300 mg by mouth daily.   metFORMIN 500 MG tablet Commonly known as: GLUCOPHAGE Take by mouth 2 (two) times daily with a meal.   oxybutynin 5 MG tablet Commonly known as: DITROPAN Take 1 tablet (5 mg total) by  mouth every 8 (eight) hours as needed for up to 14 days for bladder spasms.   oxyCODONE-acetaminophen 5-325 MG tablet Commonly known as: PERCOCET/ROXICET Take 1-2 tablets by mouth every 4 (four) hours as needed for moderate pain.   sulfamethoxazole-trimethoprim 800-160 MG tablet Commonly known as: BACTRIM DS Take 1 tablet by mouth 2 (two) times daily for 14 days.   tamsulosin 0.4 MG Caps capsule Commonly known as: FLOMAX Take 1 capsule (0.4 mg total) by mouth daily.   venlafaxine 37.5 MG tablet Commonly known as: EFFEXOR Take 37.5 mg by mouth daily.      Followup:  Follow-up Information    Hollice Espy, MD On 06/04/2019.   Specialty: Urology Why: [id number] for Surgery Contact information:  [address] [place] [address] [phone]

## 2019-05-26 LAB — CULTURE, BLOOD (ROUTINE X 2)

## 2019-05-28 ENCOUNTER — Other Ambulatory Visit: Payer: Self-pay | Admitting: Radiology

## 2019-05-28 ENCOUNTER — Telehealth: Payer: Self-pay | Admitting: Radiology

## 2019-05-28 LAB — CULTURE, BLOOD (ROUTINE X 2)
Culture: NO GROWTH
Special Requests: ADEQUATE

## 2019-05-28 NOTE — Telephone Encounter (Signed)
Patient was given the [facility] Information form below as well as the Instructions for Pre-Admission Testing form & a map of [facility].    [place], [place], [place] [address] Telephone: [phone] Fax: [phone]   Thank you for choosing [facility] for your upcoming surgery!  We are always here to assist in your urological needs.  Please read the following information with specific details for your upcoming appointments related to your surgery. Please contact [facility] at [phone] Option 3 with any questions.  The Name of Your Surgery: right ureteroscopy, laser lithotripsy, stone removal, ureteral stent placement Your Surgery Date: 06/04/2019 Your Surgeon: Hollice Espy  Please call Same Day Surgery at [phone] between the hours of 1pm-3pm one day prior to your surgery. They will inform you of the time to arrive at Same Day Surgery which is located on the second floor of the [facility].   Please refer to the attached letter regarding instructions for Pre-Admission Testing. You will receive a call from the [place] office regarding your appointment with them.  The Pre-Admission Testing office is located at [place], on the first floor of the [place] at [facility] in [place] (office is to the right as you enter through the [employer] of the [facility]). Please have all medications you are currently taking and your insurance card available.  A COVID-19 test will be required prior to surgery and once test is performed you will need to remain in quarantine until the day of surgery. Patient was advised to have nothing to eat or drink after midnight the night prior to surgery except that she may have only water until 2 hours before surgery with nothing to drink within 2 hours of surgery.  The patient  states she currently takes no blood thinners. Patient's questions were answered and she expressed understanding of these instructions.

## 2019-05-28 NOTE — Telephone Encounter (Signed)
LMOM to return call. Need to discuss surgery scheduled 06/04/2019 with Dr Erlene Quan.

## 2019-05-31 ENCOUNTER — Other Ambulatory Visit
Admission: RE | Admit: 2019-05-31 | Discharge: 2019-05-31 | Disposition: A | Discharge status: Home / Self Care | Payer: BLUE CROSS/BLUE SHIELD | Source: Ambulatory Visit | Attending: Urology | Admitting: Urology

## 2019-05-31 ENCOUNTER — Ambulatory Visit: Payer: BC Managed Care – PPO

## 2019-05-31 ENCOUNTER — Other Ambulatory Visit: Payer: Self-pay

## 2019-05-31 DIAGNOSIS — Z20828 Contact with and (suspected) exposure to other viral communicable diseases: Secondary | ICD-10-CM | POA: Insufficient documentation

## 2019-05-31 DIAGNOSIS — Z01812 Encounter for preprocedural laboratory examination: Secondary | ICD-10-CM | POA: Diagnosis not present

## 2019-06-01 ENCOUNTER — Encounter
Admission: RE | Admit: 2019-06-01 | Discharge: 2019-06-01 | Disposition: A | Discharge status: Home / Self Care | Payer: BLUE CROSS/BLUE SHIELD | Source: Ambulatory Visit | Attending: Urology | Admitting: Urology

## 2019-06-01 ENCOUNTER — Other Ambulatory Visit: Payer: Self-pay

## 2019-06-01 LAB — SARS CORONAVIRUS 2 (TAT 6-24 HRS): SARS Coronavirus 2: NEGATIVE

## 2019-06-01 NOTE — Patient Instructions (Addendum)
Your procedure is scheduled on: 06-04-19 MONDAY Report to Same Day Surgery 2nd floor medical mall [facility](Medical Mall Entrance-take elevator on left to 2nd floor.  Check in with surgery information desk.) To find out your arrival time please call [phone] between 1PM - 3PM on 06-01-19 FRIDAY  Remember: Instructions that are not followed completely may result in serious medical risk, up to and including death, or upon the discretion of your surgeon and anesthesiologist your surgery may need to be rescheduled.    _x___ 1. Do not eat food after midnight the night before your procedure. NO GUM OR CANDY AFTER MIDNIGHT.You may drink WATER up to 2 hours before you are scheduled to arrive at the hospital for your procedure.  Do not drink WATER within 2 hours of your scheduled arrival to the hospital.  Type 1 and type 2 diabetics should only drink water.   ____Ensure clear carbohydrate drink on the way to the hospital for bariatric patients  ____Ensure clear carbohydrate drink 3 hours before surgery.    __x__ 2. No Alcohol for 24 hours before or after surgery.   __x__3. No Smoking or e-cigarettes for 24 prior to surgery.  Do not use any chewable tobacco products for at least 6 hour prior to surgery   ____  4. Bring all medications with you on the day of surgery if instructed.    __x__ 5. Notify your doctor if there is any change in your medical condition     (cold, fever, infections).    x___6. On the morning of surgery brush your teeth with toothpaste and water.  You may rinse your mouth with mouth wash if you wish.  Do not swallow any toothpaste or mouthwash.   Do not wear jewelry, make-up, hairpins, clips or nail polish.  Do not wear lotions, powders, or perfumes. You may wear deodorant.  Do not shave 48 hours prior to surgery. Men may shave face and neck.  Do not bring valuables to the hospital.    [facility] is not responsible for any belongings or valuables.               Contacts,  dentures or bridgework may not be worn into surgery.  Leave your suitcase in the car. After surgery it may be brought to your room.  For patients admitted to the hospital, discharge time is determined by your treatment team.  _  Patients discharged the day of surgery will not be allowed to drive home.  You will need someone to drive you home and stay with you the night of your procedure.    Please read over the following fact sheets that you were given:   [facility] Preparing for [facility] and or MRSA Information   ____ Take anti-hypertensive listed below, cardiac, seizure, asthma,anti-reflux and psychiatric medicines. These include:  1. NONE  2.  3.  4.  5.  6.  ____Fleets enema or Magnesium Citrate as directed.   ____ Use CHG Soap or sage wipes as directed on instruction sheet   ____ Use inhalers on the day of surgery and bring to hospital day of surgery  _X___ Stop Metformin 2 days prior to surgery-LAST DOSE TODAY (Friday 14TH)    ____ Take 1/2 of usual insulin dose the night before surgery and none on the morning surgery.   ____ Follow recommendations from Cardiologist, Pulmonologist or PCP regarding stopping Aspirin, Coumadin, Plavix ,Eliquis, Effient, or Pradaxa, and Pletal.  X____Stop Anti-inflammatories such as Advil, Aleve, Ibuprofen, Motrin, Naproxen,  Naprosyn, Goodies powders or aspirin products NOW-OK to take Tylenol    ____ Stop supplements until after surgery.   ____ Bring C-Pap to the hospital.

## 2019-06-03 MED ORDER — SODIUM CHLORIDE 0.9 % IV SOLN
1.0000 g | INTRAVENOUS | Status: AC
  Administered 2019-06-04: 1 g via INTRAVENOUS
  Filled 2019-06-03: qty 10

## 2019-06-04 ENCOUNTER — Ambulatory Visit: Payer: BLUE CROSS/BLUE SHIELD | Admitting: Anesthesiology

## 2019-06-04 ENCOUNTER — Other Ambulatory Visit: Payer: Self-pay

## 2019-06-04 ENCOUNTER — Ambulatory Visit
Admission: RE | Admit: 2019-06-04 | Discharge: 2019-06-04 | Disposition: A | Discharge status: Home / Self Care | Payer: BLUE CROSS/BLUE SHIELD | Attending: Urology | Admitting: Urology

## 2019-06-04 ENCOUNTER — Encounter: Payer: Self-pay | Admitting: *Deleted

## 2019-06-04 ENCOUNTER — Encounter
Admission: RE | Disposition: A | Discharge status: Home / Self Care | Payer: Self-pay | Source: Home / Self Care | Attending: Urology

## 2019-06-04 ENCOUNTER — Ambulatory Visit: Payer: BLUE CROSS/BLUE SHIELD

## 2019-06-04 DIAGNOSIS — E119 Type 2 diabetes mellitus without complications: Secondary | ICD-10-CM | POA: Diagnosis not present

## 2019-06-04 DIAGNOSIS — Z79899 Other long term (current) drug therapy: Secondary | ICD-10-CM | POA: Insufficient documentation

## 2019-06-04 DIAGNOSIS — Z7984 Long term (current) use of oral hypoglycemic drugs: Secondary | ICD-10-CM | POA: Insufficient documentation

## 2019-06-04 DIAGNOSIS — N201 Calculus of ureter: Secondary | ICD-10-CM | POA: Insufficient documentation

## 2019-06-04 DIAGNOSIS — J45909 Unspecified asthma, uncomplicated: Secondary | ICD-10-CM | POA: Insufficient documentation

## 2019-06-04 DIAGNOSIS — I10 Essential (primary) hypertension: Secondary | ICD-10-CM | POA: Diagnosis not present

## 2019-06-04 DIAGNOSIS — F329 Major depressive disorder, single episode, unspecified: Secondary | ICD-10-CM | POA: Insufficient documentation

## 2019-06-04 DIAGNOSIS — N29 Other disorders of kidney and ureter in diseases classified elsewhere: Secondary | ICD-10-CM | POA: Diagnosis not present

## 2019-06-04 DIAGNOSIS — M109 Gout, unspecified: Secondary | ICD-10-CM | POA: Diagnosis not present

## 2019-06-04 DIAGNOSIS — Z87440 Personal history of urinary (tract) infections: Secondary | ICD-10-CM | POA: Diagnosis not present

## 2019-06-04 HISTORY — PX: CYSTOSCOPY/URETEROSCOPY/HOLMIUM LASER/STENT PLACEMENT: SHX6546

## 2019-06-04 LAB — POCT PREGNANCY, URINE: Preg Test, Ur: NEGATIVE

## 2019-06-04 LAB — GLUCOSE, CAPILLARY
Glucose-Capillary: 110 mg/dL — ABNORMAL HIGH (ref 70–99)
Glucose-Capillary: 120 mg/dL — ABNORMAL HIGH (ref 70–99)

## 2019-06-04 SURGERY — CYSTOSCOPY/URETEROSCOPY/HOLMIUM LASER/STENT PLACEMENT
Anesthesia: General | Site: Ureter | Laterality: Right

## 2019-06-04 MED ORDER — ROCURONIUM BROMIDE 50 MG/5ML IV SOLN
INTRAVENOUS | Status: AC
  Filled 2019-06-04: qty 1

## 2019-06-04 MED ORDER — ROCURONIUM BROMIDE 100 MG/10ML IV SOLN
INTRAVENOUS | Status: DC | PRN
  Administered 2019-06-04: 40 mg via INTRAVENOUS
  Administered 2019-06-04: 10 mg via INTRAVENOUS

## 2019-06-04 MED ORDER — OXYCODONE HCL 5 MG PO TABS: 5.0000 mg | ORAL_TABLET | Freq: Once | ORAL | Status: DC | PRN

## 2019-06-04 MED ORDER — FAMOTIDINE 20 MG PO TABS
ORAL_TABLET | ORAL | Status: AC
  Filled 2019-06-04: qty 1

## 2019-06-04 MED ORDER — LACTATED RINGERS IV SOLN
INTRAVENOUS | Status: DC | PRN
  Administered 2019-06-04 (×2): via INTRAVENOUS

## 2019-06-04 MED ORDER — DEXAMETHASONE SODIUM PHOSPHATE 10 MG/ML IJ SOLN
INTRAMUSCULAR | Status: AC
  Filled 2019-06-04: qty 1

## 2019-06-04 MED ORDER — PROPOFOL 10 MG/ML IV BOLUS
INTRAVENOUS | Status: AC
  Filled 2019-06-04: qty 20

## 2019-06-04 MED ORDER — MIDAZOLAM HCL 2 MG/2ML IJ SOLN
INTRAMUSCULAR | Status: AC
  Filled 2019-06-04: qty 2

## 2019-06-04 MED ORDER — FAMOTIDINE 20 MG PO TABS
20.0000 mg | ORAL_TABLET | Freq: Once | ORAL | Status: AC
  Administered 2019-06-04: 08:00:00 20 mg via ORAL

## 2019-06-04 MED ORDER — MIDAZOLAM HCL 2 MG/2ML IJ SOLN
INTRAMUSCULAR | Status: DC | PRN
  Administered 2019-06-04: 2 mg via INTRAVENOUS

## 2019-06-04 MED ORDER — SUCCINYLCHOLINE CHLORIDE 20 MG/ML IJ SOLN
INTRAMUSCULAR | Status: AC
  Filled 2019-06-04: qty 1

## 2019-06-04 MED ORDER — ONDANSETRON HCL 4 MG PO TABS: 4.0000 mg | ORAL_TABLET | Freq: Three times a day (TID) | ORAL | 0 refills | Status: DC | PRN

## 2019-06-04 MED ORDER — FENTANYL CITRATE (PF) 250 MCG/5ML IJ SOLN
INTRAMUSCULAR | Status: AC
  Filled 2019-06-04: qty 5

## 2019-06-04 MED ORDER — DEXMEDETOMIDINE HCL 200 MCG/2ML IV SOLN
INTRAVENOUS | Status: DC | PRN
  Administered 2019-06-04: 16 ug via INTRAVENOUS

## 2019-06-04 MED ORDER — FENTANYL CITRATE (PF) 100 MCG/2ML IJ SOLN
INTRAMUSCULAR | Status: DC | PRN
  Administered 2019-06-04: 150 ug via INTRAVENOUS
  Administered 2019-06-04 (×2): 50 ug via INTRAVENOUS

## 2019-06-04 MED ORDER — DEXAMETHASONE SODIUM PHOSPHATE 10 MG/ML IJ SOLN
INTRAMUSCULAR | Status: DC | PRN
  Administered 2019-06-04: 10 mg via INTRAVENOUS

## 2019-06-04 MED ORDER — ONDANSETRON HCL 4 MG/2ML IJ SOLN
INTRAMUSCULAR | Status: DC | PRN
  Administered 2019-06-04: 4 mg via INTRAVENOUS

## 2019-06-04 MED ORDER — SUGAMMADEX SODIUM 200 MG/2ML IV SOLN
INTRAVENOUS | Status: AC
  Filled 2019-06-04: qty 2

## 2019-06-04 MED ORDER — LIDOCAINE HCL (PF) 2 % IJ SOLN
INTRAMUSCULAR | Status: AC
  Filled 2019-06-04: qty 10

## 2019-06-04 MED ORDER — ONDANSETRON HCL 4 MG/2ML IJ SOLN
INTRAMUSCULAR | Status: AC
  Filled 2019-06-04: qty 2

## 2019-06-04 MED ORDER — PROPOFOL 10 MG/ML IV BOLUS
INTRAVENOUS | Status: DC | PRN
  Administered 2019-06-04: 200 mg via INTRAVENOUS

## 2019-06-04 MED ORDER — SODIUM CHLORIDE 0.9 % IV SOLN
INTRAVENOUS | Status: DC
  Administered 2019-06-04: 08:00:00 via INTRAVENOUS

## 2019-06-04 MED ORDER — FENTANYL CITRATE (PF) 100 MCG/2ML IJ SOLN: 25.0000 ug | INTRAMUSCULAR | Status: DC | PRN

## 2019-06-04 MED ORDER — PHENYLEPHRINE HCL (PRESSORS) 10 MG/ML IV SOLN
INTRAVENOUS | Status: DC | PRN
  Administered 2019-06-04 (×2): 100 ug via INTRAVENOUS

## 2019-06-04 MED ORDER — OXYCODONE HCL 5 MG/5ML PO SOLN: 5.0000 mg | Freq: Once | ORAL | Status: DC | PRN

## 2019-06-04 MED ORDER — IOHEXOL 180 MG/ML  SOLN
INTRAMUSCULAR | Status: DC | PRN
  Administered 2019-06-04: 20 mL

## 2019-06-04 MED ORDER — SUGAMMADEX SODIUM 200 MG/2ML IV SOLN
INTRAVENOUS | Status: DC | PRN
  Administered 2019-06-04: 182.4 mg via INTRAVENOUS

## 2019-06-04 MED ORDER — LIDOCAINE 2% (20 MG/ML) 5 ML SYRINGE
INTRAMUSCULAR | Status: DC | PRN
  Administered 2019-06-04: 100 mg via INTRAVENOUS

## 2019-06-04 SURGICAL SUPPLY — 30 items
ADHESIVE MASTISOL STRL (MISCELLANEOUS) ×2 IMPLANT
BAG DRAIN CYSTO-URO LG1000N (MISCELLANEOUS) ×3 IMPLANT
BASKET ZERO TIP 1.9FR (BASKET) ×2 IMPLANT
BRUSH SCRUB EZ 1% IODOPHOR (MISCELLANEOUS) ×3 IMPLANT
CATH URETL 5X70 OPEN END (CATHETERS) ×3 IMPLANT
CNTNR SPEC 2.5X3XGRAD LEK (MISCELLANEOUS) ×1
CONT SPEC 4OZ STER OR WHT (MISCELLANEOUS) ×2
CONTAINER SPEC 2.5X3XGRAD LEK (MISCELLANEOUS) IMPLANT
DRAPE UTILITY 15X26 TOWEL STRL (DRAPES) ×3 IMPLANT
DRSG TEGADERM 2-3/8X2-3/4 SM (GAUZE/BANDAGES/DRESSINGS) ×2 IMPLANT
FIBER LASER FLEXIVA 365 (UROLOGICAL SUPPLIES) ×2 IMPLANT
FIBER LASER TRAC TIP (UROLOGICAL SUPPLIES) IMPLANT
GLOVE BIO SURGEON STRL SZ 6.5 (GLOVE) ×2 IMPLANT
GLOVE BIO SURGEONS STRL SZ 6.5 (GLOVE) ×1
GOWN STRL REUS W/ TWL LRG LVL3 (GOWN DISPOSABLE) ×2 IMPLANT
GOWN STRL REUS W/TWL LRG LVL3 (GOWN DISPOSABLE) ×4
GUIDEWIRE GREEN .038 145CM (MISCELLANEOUS) IMPLANT
GUIDEWIRE STR DUAL SENSOR (WIRE) ×3 IMPLANT
INFUSOR MANOMETER BAG 3000ML (MISCELLANEOUS) ×3 IMPLANT
INTRODUCER DILATOR DOUBLE (INTRODUCER) IMPLANT
KIT TURNOVER CYSTO (KITS) ×3 IMPLANT
PACK CYSTO AR (MISCELLANEOUS) ×3 IMPLANT
SET CYSTO W/LG BORE CLAMP LF (SET/KITS/TRAYS/PACK) ×3 IMPLANT
SHEATH URETERAL 12FRX35CM (MISCELLANEOUS) IMPLANT
SOL .9 NS 3000ML IRR  AL (IV SOLUTION) ×2
SOL .9 NS 3000ML IRR UROMATIC (IV SOLUTION) ×1 IMPLANT
STENT URET 6FRX24 CONTOUR (STENTS) ×2 IMPLANT
STENT URET 6FRX26 CONTOUR (STENTS) IMPLANT
SURGILUBE 2OZ TUBE FLIPTOP (MISCELLANEOUS) ×3 IMPLANT
WATER STERILE IRR 1000ML POUR (IV SOLUTION) ×3 IMPLANT

## 2019-06-04 NOTE — Interval H&P Note (Signed)
History and Physical Interval Note:  06/04/2019 9:50 AM  Charlotte Holloway  has presented today for surgery, with the diagnosis of RIGHT URETERAL STONE.  The various methods of treatment have been discussed with the patient and family. After consideration of risks, benefits and other options for treatment, the patient has consented to  Procedure(s): CYSTOSCOPY/URETEROSCOPY/HOLMIUM LASER/STENT EXCHANGE (Right) as a surgical intervention.  The patient's history has been reviewed, patient examined, no change in status, stable for surgery.  I have reviewed the patient's chart and labs.  Questions were answered to the patient's satisfaction.    RRR CTAB  Returns today for definitive management of her stone.  She underwent previous ureteral stent placement and was treated with the appropriate antibiotics, Bactrim for the past week.  She is clinically improved.  Risks and benefits of ureteroscopy were reviewed including but not limited to infection, bleeding, pain, ureteral injury which could require open surgery versus prolonged indwelling if ureteral perforation occurs, persistent stone disease, requirement for staged procedure, possible stent, and global anesthesia risks. Patient expressed understanding and desires to proceed with ureteroscopy.   Hollice Espy

## 2019-06-04 NOTE — Anesthesia Preprocedure Evaluation (Addendum)
Anesthesia Evaluation  Patient identified by MRN, date of birth, ID band Patient awake    Reviewed: Allergy & Precautions, H&P , NPO status , Patient's Chart, lab work & pertinent test results  Airway Mallampati: II  TM Distance: >3 FB Neck ROM: full    Dental  (+) Chipped, Missing   Pulmonary asthma ,           Cardiovascular hypertension,      Neuro/Psych PSYCHIATRIC DISORDERS Depression negative neurological ROS     GI/Hepatic negative GI ROS, Neg liver ROS,   Endo/Other  diabetes  Renal/GU Renal disease (nephrolithiasis)     Musculoskeletal   Abdominal   Peds  Hematology  (+) Blood dyscrasia, anemia ,   Anesthesia Other Findings Obese Lazy eye  Past Medical History: No date: Asthma No date: Depression No date: Diabetes mellitus without complication (HCC) No date: Gout No date: Hypertension No date: Kidney stone  Past Surgical History: No date: ANKLE SURGERY; Right     Comment:  pins and plate 05/23/2019: CYSTOSCOPY WITH STENT PLACEMENT; Right     Comment:  Procedure: CYSTOSCOPY WITH STENT PLACEMENT;  Surgeon:               Hollice Espy, MD;  Location: [facility] ORS;  Service:               Urology;  Laterality: Right; No date: TONSILLECTOMY  BMI    Body Mass Index: 34.50 kg/m      Reproductive/Obstetrics negative OB ROS                          Anesthesia Physical Anesthesia Plan  ASA: II  Anesthesia Plan: General ETT   Post-op Pain Management:    Induction:   PONV Risk Score and Plan: Ondansetron, Dexamethasone and Treatment may vary due to age or medical condition  Airway Management Planned:   Additional Equipment:   Intra-op Plan:   Post-operative Plan:   Informed Consent: I have reviewed the patients History and Physical, chart, labs and discussed the procedure including the risks, benefits and alternatives for the proposed anesthesia with the patient or  authorized representative who has indicated his/her understanding and acceptance.     Dental Advisory Given  Plan Discussed with: Anesthesiologist and CRNA  Anesthesia Plan Comments:         Anesthesia Quick Evaluation

## 2019-06-04 NOTE — Anesthesia Procedure Notes (Signed)
Procedure Name: Intubation Date/Time: 06/04/2019 10:26 AM Performed by: Marsh Dolly, CRNA Pre-anesthesia Checklist: Patient identified, Patient being monitored, Timeout performed, Emergency Drugs available and Suction available Patient Re-evaluated:Patient Re-evaluated prior to induction Oxygen Delivery Method: Circle system utilized Preoxygenation: Pre-oxygenation with 100% oxygen Induction Type: IV induction Ventilation: Mask ventilation without difficulty Laryngoscope Size: 3 and Miller Grade View: Grade I Tube type: Oral Tube size: 7.5 mm Number of attempts: 1 Placement Confirmation: ETT inserted through vocal cords under direct vision,  positive ETCO2 and breath sounds checked- equal and bilateral Secured at: 21 cm Tube secured with: Tape Dental Injury: Teeth and Oropharynx as per pre-operative assessment

## 2019-06-04 NOTE — Anesthesia Post-op Follow-up Note (Signed)
Anesthesia QCDR form completed.        

## 2019-06-04 NOTE — Anesthesia Postprocedure Evaluation (Signed)
Anesthesia Post Note  Patient: Charlotte Holloway  Procedure(s) Performed: CYSTOSCOPY/URETEROSCOPY/HOLMIUM LASER/STENT EXCHANGE (Right Ureter)  Patient location during evaluation: PACU Anesthesia Type: General Level of consciousness: awake and alert Pain management: pain level controlled Vital Signs Assessment: post-procedure vital signs reviewed and stable Respiratory status: spontaneous breathing, nonlabored ventilation and respiratory function stable Cardiovascular status: blood pressure returned to baseline and stable Postop Assessment: no apparent nausea or vomiting Anesthetic complications: no     Last Vitals:  Vitals:   06/04/19 1145 06/04/19 1207  BP: 124/67 116/68  Pulse:    Resp: 10   Temp:  37 C  SpO2:      Last Pain:  Vitals:   06/04/19 1207  TempSrc:   PainSc: 0-No pain                 Durenda Hurt

## 2019-06-04 NOTE — Discharge Instructions (Signed)
You have a ureteral stent in place.  This is a tube that extends from your kidney to your bladder.  This may cause urinary bleeding, burning with urination, and urinary frequency.  Please call our office or present to the ED if you develop fevers >101 or pain which is not able to be controlled with oral pain medications.  You may be given either Flomax and/ or ditropan to help with bladder spasms and stent pain in addition to pain medications.    Your stent is on a string.  Of tape this to your left inner thigh.  You may untape and remove this on Friday morning.  If you have any difficulties, please call our office will be happy to assist.  Please do your best not to prematurely dislodge your stent.  [facility] [address], [place], [place] [address] [phone]  AMBULATORY SURGERY  DISCHARGE INSTRUCTIONS   1) The drugs that you were given will stay in your system until tomorrow so for the next 24 hours you should not:  A) Drive an automobile B) Make any legal decisions C) Drink any alcoholic beverage   2) You may resume regular meals tomorrow.  Today it is better to start with liquids and gradually work up to solid foods.  You may eat anything you prefer, but it is better to start with liquids, then soup and crackers, and gradually work up to solid foods.   3) Please notify your doctor immediately if you have any unusual bleeding, trouble breathing, redness and pain at the surgery site, drainage, fever, or pain not relieved by medication.    4) Additional Instructions:        Please contact your physician with any problems or Same Day Surgery at [phone], Monday through Friday 6 am to 4 pm, or [place] at [facility] number at [phone].

## 2019-06-04 NOTE — Transfer of Care (Signed)
Immediate Anesthesia Transfer of Care Note  Patient: Charlotte Holloway  Procedure(s) Performed: CYSTOSCOPY/URETEROSCOPY/HOLMIUM LASER/STENT EXCHANGE (Right Ureter)  Patient Location: PACU  Anesthesia Type:General  Level of Consciousness: awake, alert  and oriented  Airway & Oxygen Therapy: Patient Spontanous Breathing and Patient connected to face mask oxygen  Post-op Assessment: Report given to RN and Post -op Vital signs reviewed and stable  Post vital signs: Reviewed and stable  Last Vitals:  Vitals Value Taken Time  BP 119/68 06/04/19 1121  Temp 37.2 C 06/04/19 1121  Pulse 69 06/04/19 1123  Resp 13 06/04/19 1123  SpO2 95 % 06/04/19 1123  Vitals shown include unvalidated device data.  Last Pain:  Vitals:   06/04/19 0749  TempSrc: Oral  PainSc: 0-No pain         Complications: No apparent anesthesia complications

## 2019-06-04 NOTE — Op Note (Signed)
Date of procedure: 06/04/19  Preoperative diagnosis:  1. Right distal ureteral calculus 2. History of urinary sepsis  Postoperative diagnosis:  1. Same as above  Procedure: 1. Right ureteroscopy 2. Laser lithotripsy 3. Basket extraction of stone fragment 4. Right ureteral stent replacement 5. Right retrograde pyelogram  Surgeon: Hollice Espy, MD  Anesthesia: General  Complications: None  Intraoperative findings: Large mildly impacted right distal ureteral calculus, all fragments removed.  Stent placed on tether.  EBL: Minimal  Specimens: Stone fragments  Drains: 6 x 24 French double-J ureteral stent on tether  Indication: Charlotte Holloway is a 33 y.o. patient with a 12 mm right distal ureteral calculus previously underwent stent placement in the setting of sepsis of urinary source.  She is recovered and been adequately treated with the appropriate antibiotics.  She returns today for definitive management of her stone.  After reviewing the management options for treatment, she elected to proceed with the above surgical procedure(s). We have discussed the potential benefits and risks of the procedure, side effects of the proposed treatment, the likelihood of the patient achieving the goals of the procedure, and any potential problems that might occur during the procedure or recuperation. Informed consent has been obtained.  Description of procedure:  The patient was taken to the operating room and general anesthesia was induced.  The patient was placed in the dorsal lithotomy position, prepped and draped in the usual sterile fashion, and preoperative antibiotics were administered. A preoperative time-out was performed.   21 [place] scope was advanced per urethra into the bladder.  Attention was turned to the right ureteral orifice from which a ureteral stent was seen emanating.  The distal coil of the stent was grasped and brought to level the urethral meatus.  Was then cannulated  using a sensor wire up to level the kidney.  This is now up to place as a safety wire.  A 4.5 semirigid ureteroscope was then advanced to the level of the stone.  A 365 laser fiber was then brought in and using the settings of 0.8 J and 10 Hz, the stone was fragmented into numerous tiny pieces.  Each of these pieces was then extracted using 1.9 [place] tipless nitinol basket.  Once the entire ureter was cleared of stone, the scope was advanced all the way up to level the proximal ureter.  Contrast was injected to perform a retrograde pyelogram which showed persistent mild to moderate hydronephrosis without extravasation and no further filling defects within the distal ureter.  There is some mild edema where the stone had previously been lodged but otherwise the ureter appeared to be patent.  Finally, a 6 x 24 French double-J ureteral stent on a tether was advanced to the level of the kidney under fluoroscopic guidance.  The wires partially drawn till focal is noted both within the renal pelvis as well as within the bladder.  Bladder was then drained.  Some fragments were sent off for stone analysis.  The patient was then clean and dry.  The stent string was affixed to the patient's left inner thigh using muscle and Tegaderm.  She was then repositioned in supine position reverse when she is been taken the PACU in stable condition.  There are no complications.  Plan: She will remove her own stent on Friday.  She will return in 4 weeks with renal ultrasound prior.  Hollice Espy, M.D.

## 2019-06-14 LAB — CALCULI, WITH PHOTOGRAPH (CLINICAL LAB)
Calcium Oxalate Dihydrate: 10 %
Calcium Oxalate Monohydrate: 85 %
Hydroxyapatite: 5 %
Weight Calculi: 48 mg

## 2019-06-28 ENCOUNTER — Other Ambulatory Visit: Payer: Self-pay

## 2019-06-28 ENCOUNTER — Ambulatory Visit
Admission: RE | Admit: 2019-06-28 | Discharge: 2019-06-28 | Disposition: A | Discharge status: Home / Self Care | Payer: BLUE CROSS/BLUE SHIELD | Source: Ambulatory Visit | Attending: Urology | Admitting: Urology

## 2019-06-28 DIAGNOSIS — N201 Calculus of ureter: Secondary | ICD-10-CM | POA: Insufficient documentation

## 2019-07-02 ENCOUNTER — Encounter: Payer: Self-pay | Admitting: Physician Assistant

## 2019-07-02 ENCOUNTER — Ambulatory Visit (INDEPENDENT_AMBULATORY_CARE_PROVIDER_SITE_OTHER): Payer: BLUE CROSS/BLUE SHIELD | Admitting: Physician Assistant

## 2019-07-02 ENCOUNTER — Other Ambulatory Visit: Payer: Self-pay

## 2019-07-02 VITALS — BP 159/99 | HR 102 | Ht 64.0 in | Wt 214.5 lb

## 2019-07-02 DIAGNOSIS — N201 Calculus of ureter: Secondary | ICD-10-CM

## 2019-07-02 DIAGNOSIS — M109 Gout, unspecified: Secondary | ICD-10-CM | POA: Insufficient documentation

## 2019-07-02 DIAGNOSIS — L83 Acanthosis nigricans: Secondary | ICD-10-CM | POA: Insufficient documentation

## 2019-07-02 DIAGNOSIS — E669 Obesity, unspecified: Secondary | ICD-10-CM | POA: Insufficient documentation

## 2019-07-02 LAB — URINALYSIS, COMPLETE
Bilirubin, UA: NEGATIVE
Glucose, UA: NEGATIVE
Ketones, UA: NEGATIVE
Leukocytes,UA: NEGATIVE
Nitrite, UA: NEGATIVE
RBC, UA: NEGATIVE
Specific Gravity, UA: 1.025 (ref 1.005–1.030)
Urobilinogen, Ur: 0.2 mg/dL (ref 0.2–1.0)
pH, UA: 5.5 (ref 5.0–7.5)

## 2019-07-02 LAB — MICROSCOPIC EXAMINATION
Bacteria, UA: NONE SEEN
RBC, Urine: NONE SEEN /hpf (ref 0–2)

## 2019-07-02 NOTE — Progress Notes (Signed)
07/02/2019 8:18 AM   Charlotte Holloway Oct 30, 1985 [id number]  CC: Postop right ureteroscopy  HPI: Charlotte Holloway is a 33 y.o. female who presents for postoperative evaluation s/p right ureteroscopy.  She originally presented to the ED on 05/23/2019 with a 3-day history of fever, right flank pain, and vomiting. CT abdomen pelvis with contrast revealed a 9 x 11 x 73mm distal right ureteral stone with associated severe right hydroureteronephrosis; no other stones visualized. Urinalysis with >50 WBC/hpf and many bacteria. WBC 14.8, creatinine 1.08. Urine culture with pan-sensitive E coli.  She underwent cystoscopy with right retrograde pyelogram and right ureteral stent placement with Dr. Apolinar Junes on 05/23/2019. There were no complications. Infection treated with ceftriaxone and transitioned to Bactrim as outpatient therapy.  She proceeded with right ureteroscopy with laser lithotripsy and basket extraction of stone fragment, with right ureteral stent placement and right retrograde pyelogram with Dr. Apolinar Junes on 06/04/2019. No complications noted. Ureteral stent removed by the patient on 06/08/2019.  Stone analysis returned with 85% calcium oxalate monohydrate, 10% calcium oxalate dihydrate, and 5% hydroxyapatite.  In the interim, she reports feeling well. She denies pain and gross hematuria.  Follow-up renal US revealed resolution of hydronephrosis. In-office UA today positive for 1+ protein but otherwise negative.  Of note, patient reports a history of approximately 4 total kidney stones in the past 5 years including the most recent episode. She has never undergone a metabolic evaluation for these; the other stones have passed on their own without required intervention. She reports poor oral hydration and consumption of dark sodas.  PMH: Past Medical History:  Diagnosis Date  . Asthma   . Depression   . Diabetes mellitus without complication (HCC)   . Gout   . Hypertension   . Kidney  stone     Surgical History: Past Surgical History:  Procedure Laterality Date  . ANKLE SURGERY Right    pins and plate  . CYSTOSCOPY WITH STENT PLACEMENT Right 05/23/2019   Procedure: CYSTOSCOPY WITH STENT PLACEMENT;  Surgeon: Vanna Scotland, MD;  Location: [facility] ORS;  Service: Urology;  Laterality: Right;  . CYSTOSCOPY/URETEROSCOPY/HOLMIUM LASER/STENT PLACEMENT Right 06/04/2019   Procedure: CYSTOSCOPY/URETEROSCOPY/HOLMIUM LASER/STENT EXCHANGE;  Surgeon: Vanna Scotland, MD;  Location: [facility] ORS;  Service: Urology;  Laterality: Right;  . TONSILLECTOMY      Home Medications:  Allergies as of 07/02/2019   No Known Allergies     Medication List       Accurate as of July 02, 2019 11:59 PM. If you have any questions, ask your nurse or doctor.        STOP taking these medications   ondansetron 4 MG tablet Commonly known as: Zofran Stopped by: Carman Ching, PA-C   oxyCODONE-acetaminophen 5-325 MG tablet Commonly known as: PERCOCET/ROXICET Stopped by: Carman Ching, PA-C     TAKE these medications   acetaminophen 500 MG tablet Commonly known as: TYLENOL Take 1,000 mg by mouth every 6 (six) hours as needed for moderate pain.   allopurinol 300 MG tablet Commonly known as: ZYLOPRIM Take 300 mg by mouth every evening.   fenofibrate 48 MG tablet Commonly known as: TRICOR Take 48 mg by mouth every evening.   lisinopril 5 MG tablet Commonly known as: ZESTRIL Take 5 mg by mouth every evening.   Low-Ogestrel 0.3-30 MG-MCG tablet Generic drug: norgestrel-ethinyl estradiol Take 1 tablet by mouth daily.   metFORMIN 500 MG tablet Commonly known as: GLUCOPHAGE Take 500 mg by mouth 2 (two) times daily with a meal.  venlafaxine XR 37.5 MG 24 hr capsule Commonly known as: EFFEXOR-XR Take 37.5 mg by mouth every evening.       Allergies: No Known Allergies  Family History: No family history on file.  Social History:  reports that she has never smoked.  She has never used smokeless tobacco. She reports that she does not drink alcohol or use drugs.  ROS: UROLOGY Frequent Urination?: Yes Hard to postpone urination?: No Burning/pain with urination?: No Get up at night to urinate?: No Leakage of urine?: No Urine stream starts and stops?: No Trouble starting stream?: No Do you have to strain to urinate?: No Blood in urine?: No Urinary tract infection?: No Sexually transmitted disease?: No Injury to kidneys or bladder?: No Painful intercourse?: No Weak stream?: No Currently pregnant?: No Vaginal bleeding?: No Last menstrual period?: OCPs  Gastrointestinal Nausea?: No Vomiting?: No Indigestion/heartburn?: No Diarrhea?: No Constipation?: Yes  Constitutional Fever: No Night sweats?: No Weight loss?: No Fatigue?: No  Skin Skin rash/lesions?: No Itching?: No  Eyes Blurred vision?: No Double vision?: No  Ears/Nose/Throat Sore throat?: No Sinus problems?: No  Hematologic/Lymphatic Swollen glands?: No Easy bruising?: No  Cardiovascular Leg swelling?: No Chest pain?: No  Respiratory Cough?: No Shortness of breath?: No  Endocrine Excessive thirst?: No  Musculoskeletal Back pain?: Yes Joint pain?: No  Neurological Headaches?: No Dizziness?: No  Psychologic Depression?: Yes Anxiety?: Yes  Physical Exam: BP (!) 159/99 (BP Location: Left Arm, Patient Position: Sitting, Cuff Size: Normal)   Pulse (!) 102   Ht 5\' 4"  (1.626 m)   Wt 214 lb 8 oz (97.3 kg)   BMI 36.82 kg/m   Constitutional:  Alert and oriented, No acute distress. HEENT: [place] AT, moist mucus membranes.  Right exotropia. Cardiovascular: No clubbing, cyanosis, or edema. Respiratory: Normal respiratory effort, no increased work of breathing. Skin: No rashes. Neurologic: Grossly intact, no focal deficits, moving all 4 extremities. Psychiatric: Normal mood and affect.  Laboratory Data: Results for orders placed or performed in visit on  07/02/19  Microscopic Examination   URINE  Result Value Ref Range   WBC, UA 0-5 0 - 5 /hpf   RBC None seen 0 - 2 /hpf   Epithelial Cells (non renal) 0-10 0 - 10 /hpf   Bacteria, UA None seen None seen/Few  Urinalysis, Complete  Result Value Ref Range   Specific Gravity, UA 1.025 1.005 - 1.030   pH, UA 5.5 5.0 - 7.5   Color, UA Yellow Yellow   Appearance Ur Hazy (A) Clear   Leukocytes,UA Negative Negative   Protein,UA 1+ (A) Negative/Trace   Glucose, UA Negative Negative   Ketones, UA Negative Negative   RBC, UA Negative Negative   Bilirubin, UA Negative Negative   Urobilinogen, Ur 0.2 0.2 - 1.0 mg/dL   Nitrite, UA Negative Negative   Microscopic Examination See below:    Calculi, with Photograph  Result Value Ref Range   Source Calculi Comment    Color Calculi Brown    Size Calculi 3x4 mm   Weight Calculi 48 mg   Composition Calculi Comment    Calcium Oxalate Monohydrate 85 %   Calcium Oxalate Dihydrate 10 %   Hydroxyapatite 5 %   Photo Calculi Comment    Comment Calculi 3 Comment    Please Note: Comment    DISCLAIMER: Comment    Pertinent Imaging: Results for orders placed during the hospital encounter of 06/28/19  US RENAL   Narrative CLINICAL DATA:  History of right ureteral stone.  EXAM: RENAL / URINARY TRACT ULTRASOUND COMPLETE  COMPARISON:  None.  FINDINGS: Right Kidney:  Renal measurements: 11.3 x 5.1 x 5.2 cm = volume: 157.5 mL . Echogenicity within normal limits. No mass or hydronephrosis visualized.  Left Kidney:  Renal measurements: 13.3 x 6.1 x 5.9 cm = volume: 250.6 mL. Echogenicity within normal limits. No mass or hydronephrosis visualized.  Bladder:  Appears normal for degree of bladder distention.  IMPRESSION: No hydronephrosis.   Electronically Signed   By: Lovey Newcomer M.D.   On: 06/28/2019 20:15    I was unable to personally review the images referenced above; no imaging attached to radiology report in Epic.  Assessment &  Plan:   1. Right ureteral stone Patient with resolution of right hydronephrosis and flank pain following definitive stone management.  Per operative report, Dr. Erlene Quan believes that she removed the entire calculus from the distal right ureter.  Patient reports resolution of symptoms in the interim.  UA un-concerning for infection.  No other stones visualized per CT abdomen pelvis on 05/23/2019.  Patient has recurrent stone former who has not previously undergone metabolic work-up.  I offered this today, she would prefer to defer this at this time.  I provided patient with an ABCs of kidney stones booklet today.  I counseled her on her stone composition being primarily calcium oxalate, suggesting an etiology of dehydration.  I counseled her to initiate a low oxalate diet moving forward, increase dietary citrate, and increase oral hydration for prevention of future stones.  She expressed an understanding of this plan.  Patient to follow-up as needed. - Urinalysis, Complete  Debroah Loop, [facility]  [place] [address] [place], [place] [address] [phone]

## 2019-08-01 ENCOUNTER — Other Ambulatory Visit: Payer: Self-pay

## 2019-08-01 ENCOUNTER — Emergency Department: Payer: BLUE CROSS/BLUE SHIELD

## 2019-08-01 ENCOUNTER — Emergency Department
Admission: EM | Admit: 2019-08-01 | Discharge: 2019-08-01 | Disposition: A | Discharge status: Home / Self Care | Payer: BLUE CROSS/BLUE SHIELD | Attending: Emergency Medicine | Admitting: Emergency Medicine

## 2019-08-01 DIAGNOSIS — E119 Type 2 diabetes mellitus without complications: Secondary | ICD-10-CM | POA: Diagnosis not present

## 2019-08-01 DIAGNOSIS — Z79899 Other long term (current) drug therapy: Secondary | ICD-10-CM | POA: Insufficient documentation

## 2019-08-01 DIAGNOSIS — I10 Essential (primary) hypertension: Secondary | ICD-10-CM | POA: Diagnosis not present

## 2019-08-01 DIAGNOSIS — R079 Chest pain, unspecified: Secondary | ICD-10-CM | POA: Insufficient documentation

## 2019-08-01 NOTE — ED Notes (Signed)
Pt c/o having sudden chest pressure, dizziness, SOB, and being cold/clammy at work this morning. States CP then went away but came back again. History of HTN - pt states she takes lisinopril. Denies heart history. States history of DM2. Significant other at bedside. Pt alert and sitting calmly in bed.

## 2019-08-01 NOTE — ED Notes (Signed)
Pt signed printed d/c paperwork as computer in room won't connect.

## 2019-08-01 NOTE — ED Triage Notes (Signed)
Pt states she started having chest tightness with SOB while at work this morning since around 9am. Pt is in NAD on arrival,. Ambulatory to triage without difficulty breathing with a steady gait.

## 2019-08-01 NOTE — ED Notes (Signed)
EDP [place] at bedside 
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
## 2019-08-01 NOTE — Discharge Instructions (Signed)
Please schedule follow-up with a primary care provider so that you may have a CT scan performed of your chest to further evaluate possible lung nodule.  In the meantime, return to the ER for new or worsening symptoms.

## 2019-11-22 ENCOUNTER — Encounter: Payer: BLUE CROSS/BLUE SHIELD | Attending: Internal Medicine | Admitting: Dietician

## 2019-11-22 ENCOUNTER — Other Ambulatory Visit: Payer: Self-pay

## 2019-11-22 VITALS — Ht 64.0 in | Wt 240.4 lb

## 2019-11-22 DIAGNOSIS — Z6841 Body Mass Index (BMI) 40.0 and over, adult: Secondary | ICD-10-CM

## 2019-11-22 NOTE — Progress Notes (Signed)
Medical Nutrition Therapy: Visit start time: 1500  end time: 1600  Assessment:  Diagnosis: obesity Past medical history: Type 2 diabetes, gout, HTN, HLD Psychosocial issues/ stress concerns: depression, anxiety  Preferred learning method:  . Auditory-- talking/ discussion . Visual . Hands-on  Current weight: 240.4lbs with shoes  Height: 5'4" Medications, supplements: reconciled list in medical record  Progress and evaluation:   Patient reports diabetes diagnosis 2013-2014, controlled for some time, but has worsened in recent months. She tests BGs fasting + 2hr post-meal every few days, with results ranging from 150-300s.  Was ill with sepsis last august, lost about 25lbs, but has since regained + more.    Has worked to change eating habits in the past for weight loss and BG control ie stopped soda, increased whole grains, but has drifted away from healthy habits especially during depression.  She is avoiding high purine foods to prevent gout attack.   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 Dining out frequency: 3-4 meals per week.  Breakfast: 6-7am (sometimes 11am) lunch foods -- sandwich, pasta Snack: deli meat, chips at work Lunch: 3-4pm meat + carb + veg, sometimes takeout  Sleeps at [employer]: deli ham/ [place], chips Supper: sleeping; wakes up at 1:45am Snack: sleeping Beverages: diet soda, water, occasional juice  Nutrition Care Education: Topics covered:  Basic nutrition: basic food groups, appropriate nutrient balance, appropriate meal and snack schedule, general nutrition guidelines    Weight control: importance of low sugar and low fat choices, portion control strategies including measuring portions, using smaller plates, estimated energy needs for weight loss at 1500kcal, provided guidance for 45%CHO, 25% protein, and 30% fat; discussed tracking  food intake; emotional eating and importance of working on mental/ emotional health to treat underlying cause of overeating Diabetes: appropriate meal and snack schedule, appropriate carb intake and balance, healthy carb choices, role of fiber, protein, fat Other: discussed anti-inflammatory effects of increasing fruits, vegetables, whole grains, nuts (other than large amounts of high-purine foods)   Nutritional Diagnosis:  Peak-2.2 Altered nutrition-related laboratory As related to type 2 diabetes.  As evidenced by elevated blood glucose from home testing. -3.3 Overweight/obesity As related to excess calories, inadequate physical activity.  As evidenced by patient with current BMI of 41.26.  Intervention:   Instruction and discussion as noted above.  Patient voices readiness to make some lifestyle changes, she wants to improve her health and reduce the need for medications.   She agrees to continue working on emotional health with psychiatrist.   Established goals with direction from patient.   Patient declined scheduling follow-up at this time; she will schedule later if needed.  Education Materials given:  . Plate Planner with food lists, sample meal pattern . sample menus . Goals/ instructions   Learner/ who was taught:  . Patient    Level of understanding: Marland Kitchen Verbalizes/ demonstrates competency   Demonstrated degree of understanding via:   Teach back Learning barriers: . None  Willingness to learn/ readiness for change: . Acceptance, ready for change   Monitoring and Evaluation:  Dietary intake, exercise, BG control, and body weight      follow up: prn

## 2019-11-22 NOTE — Patient Instructions (Addendum)
   Eat lunch earlier during work shift -- take allotted breaks.  Try tracking food intake using app.   Resume bringing some lunches, start with 1-2 times a week. Choose lean protein + controlled portion of starch + generous portion of vegetables. OK to include some "healthy" frozen meals like lean cuisine or healthy choice, and you can add an extra veg or fruit, or if a low-carb meal, add a piece of bread or other starch.

## 2020-01-02 ENCOUNTER — Other Ambulatory Visit: Payer: Self-pay | Admitting: Internal Medicine

## 2020-01-02 DIAGNOSIS — I10 Essential (primary) hypertension: Secondary | ICD-10-CM

## 2020-01-02 DIAGNOSIS — R911 Solitary pulmonary nodule: Secondary | ICD-10-CM

## 2020-02-07 IMAGING — CT CT ABDOMEN AND PELVIS WITH CONTRAST
2 of 4 series · 16 of 46 positions shown, 18 images · IV contrast (APPLIED)
Comparison: CT of the abdomen and pelvis without contrast on
07/24/2015

CLINICAL DATA: Right-sided flank pain, nausea, vomiting and fever.

EXAM:
CT ABDOMEN AND PELVIS WITH CONTRAST
TECHNIQUE: Multidetector CT imaging of the abdomen and pelvis was performed
using the standard protocol following bolus administration of
intravenous contrast.
CONTRAST:  100mL OMNIPAQUE IOHEXOL 300 MG/ML  SOLN

[Series 2: routine abd/pel with · axial · 0.92mm/px · z∈[-496,-41]mm · 13 of 101 slices shown, 15 images]
[im 5/101  soft-tissue]
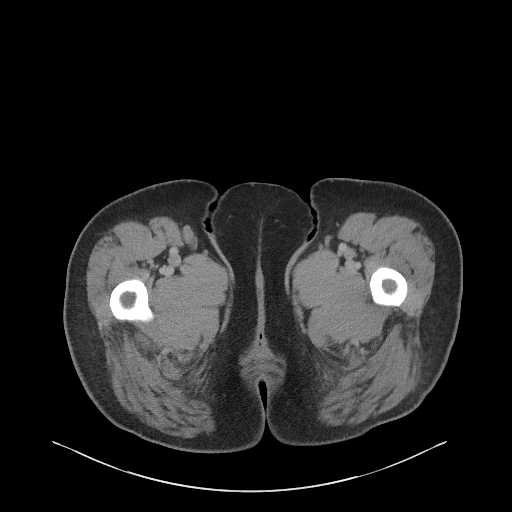
[im 5/101  bone]
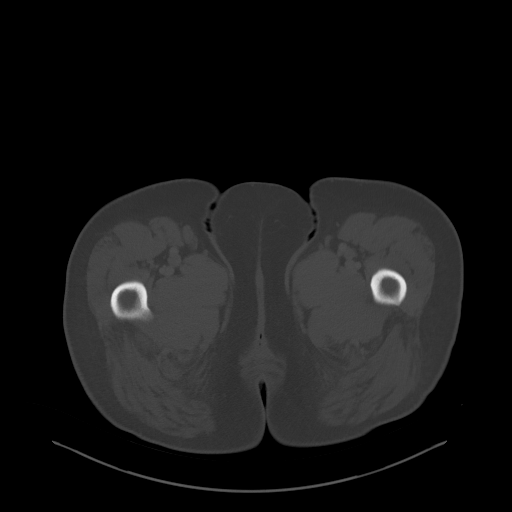
[im 14/101  soft-tissue]
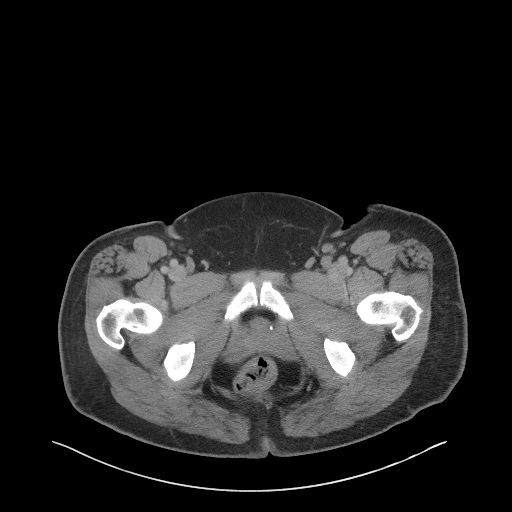
[im 23/101  soft-tissue]
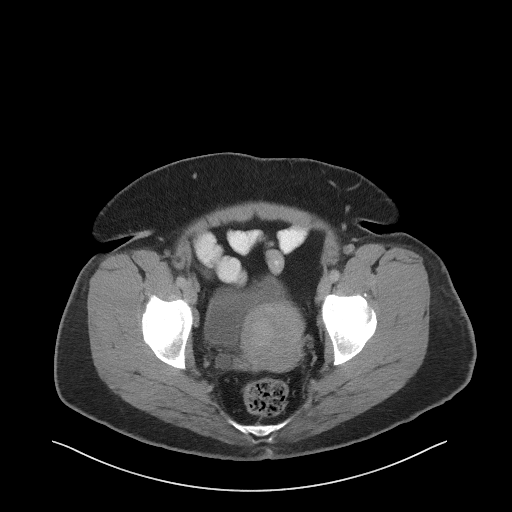
[im 28/101  soft-tissue]
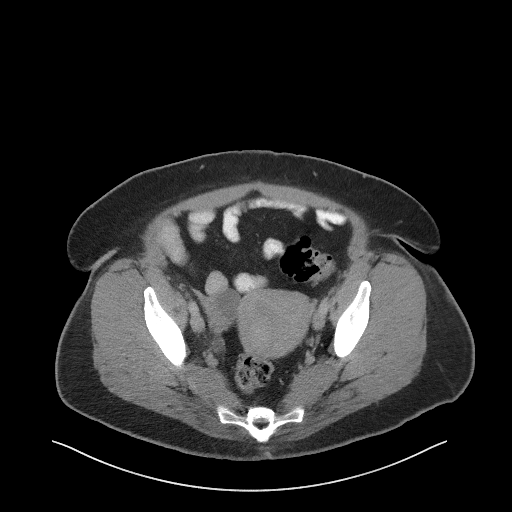
[im 37/101  soft-tissue]
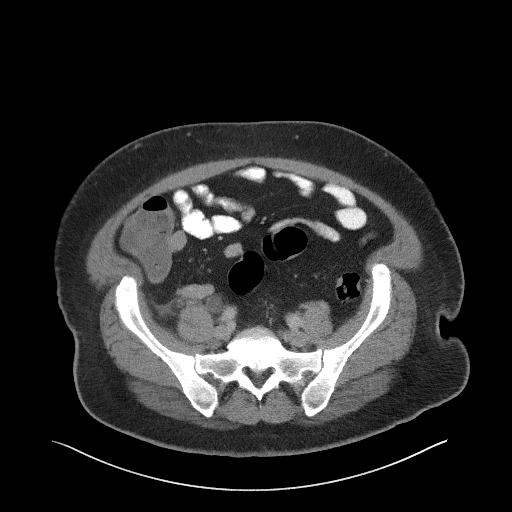
[im 41/101  soft-tissue]
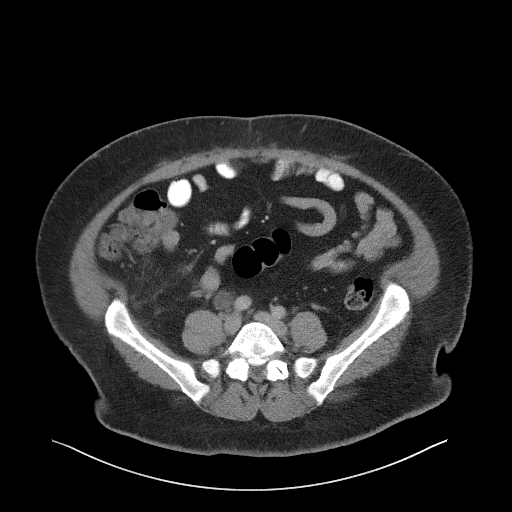
[im 51/101  soft-tissue]
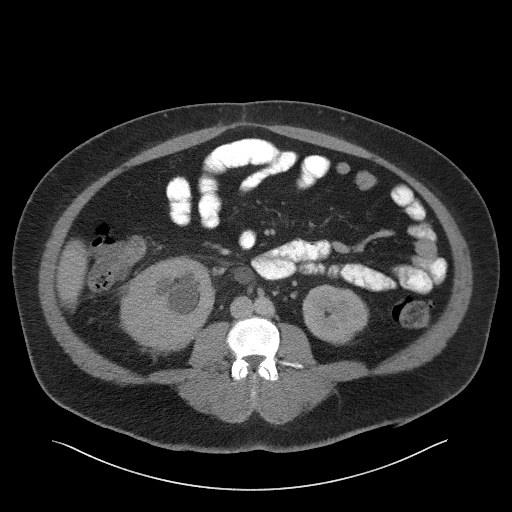
[im 60/101  soft-tissue]
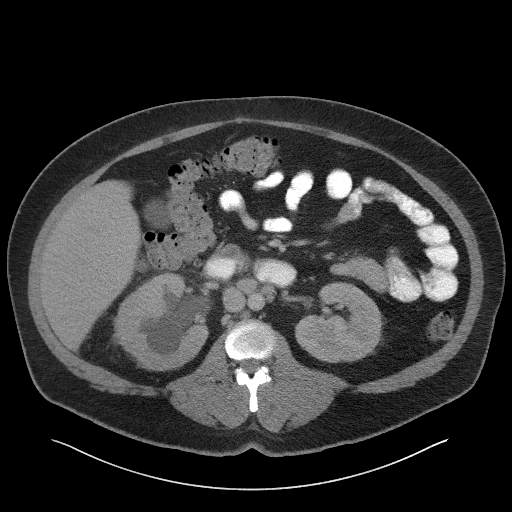
[im 64/101  soft-tissue]
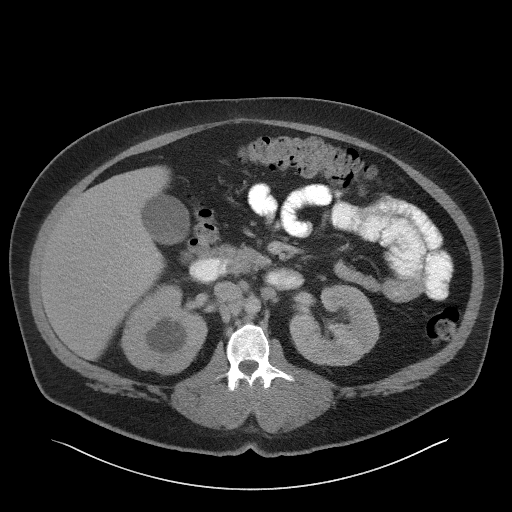
[im 64/101  bone]
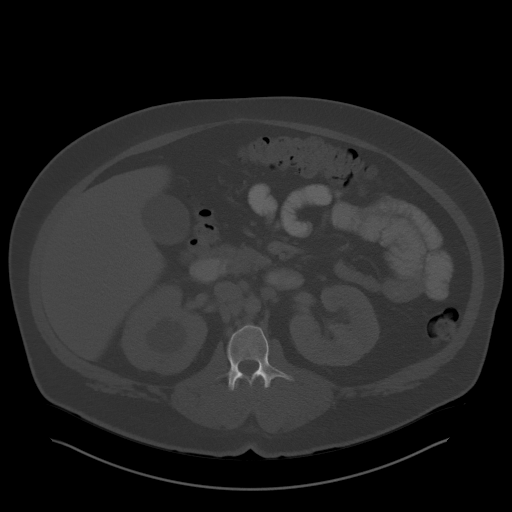
[im 73/101  soft-tissue]
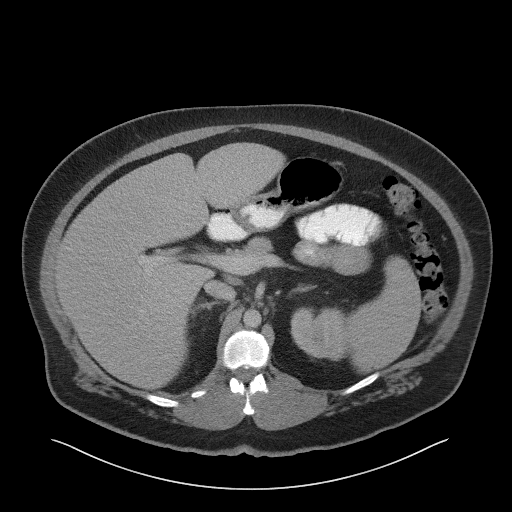
[im 78/101  soft-tissue]
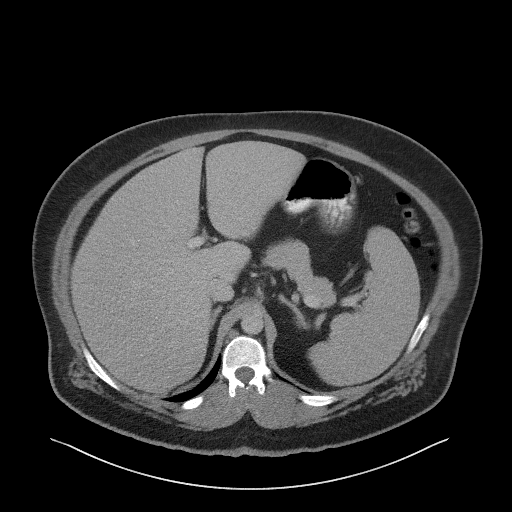
[im 87/101  soft-tissue]
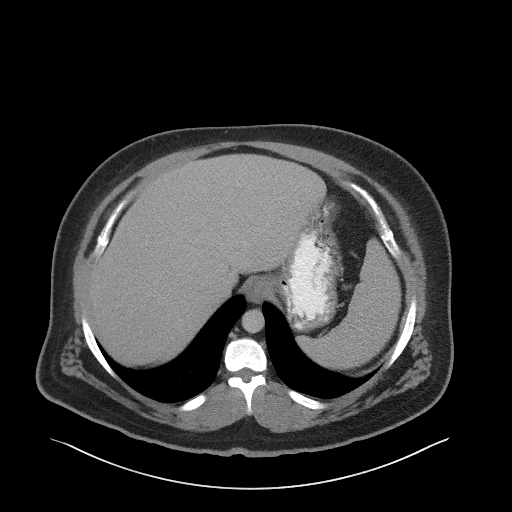
[im 96/101  soft-tissue]
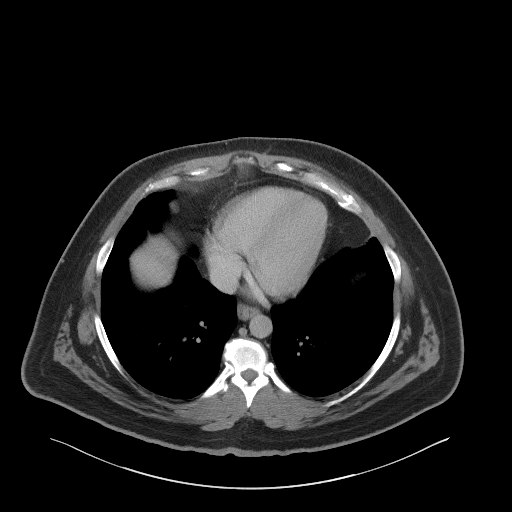

[Series 5: coronal st · coronal · 0.94mm/px · 3 of 113 slices shown]
[im 38/113  soft-tissue]
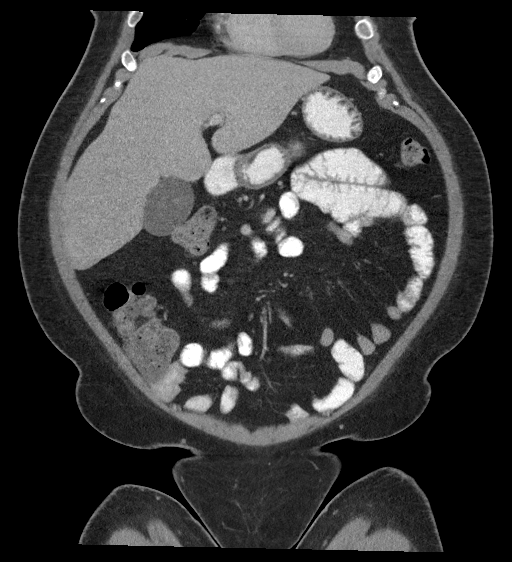
[im 50/113  soft-tissue]
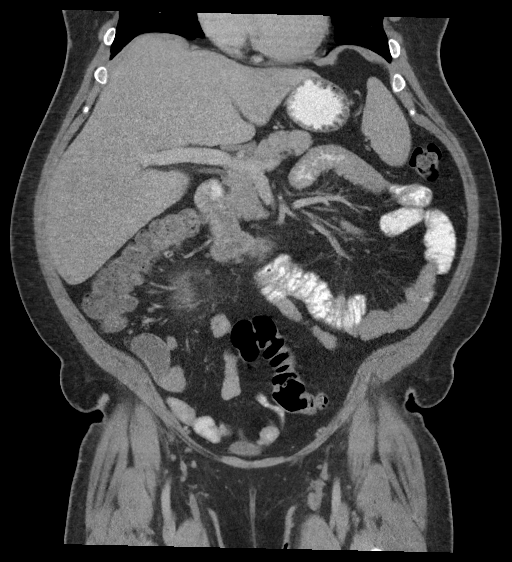
[im 63/113  soft-tissue]
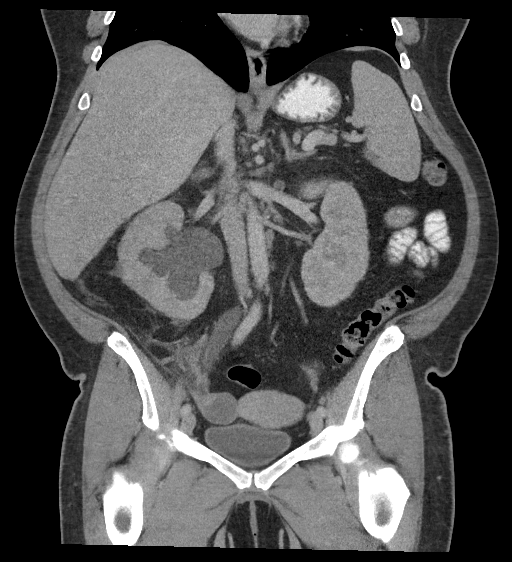

[16 of 46 positions shown; findings below may reference images not displayed]

FINDINGS: Lower chest: No acute abnormality.

Hepatobiliary: No focal liver abnormality is seen. No gallstones,
gallbladder wall thickening, or biliary dilatation.

Pancreas: Unremarkable. No pancreatic ductal dilatation or
surrounding inflammatory changes.

Spleen: Normal in size without focal abnormality.

Adrenals/Urinary Tract: Adrenal glands are unremarkable. Severe
right-sided hydronephrosis and hydroureter present secondary to a
calculus located in the distal ureter just above the ureterovesical
junction and measuring approximately 9 x 11 x 12 mm. No additional
calculi identified. No evidence of left-sided hydronephrosis. The
bladder is unremarkable.

Stomach/Bowel: Bowel shows no evidence of obstruction, ileus,
inflammation or lesion. No free air identified.

Vascular/Lymphatic: There are some scattered small upper to mid
abdominal retroperitoneal lymph nodes not seen previously. The
largest is in anterior aortocaval node measuring 14 mm in short
axis.

Reproductive: Probable right-sided uterine fibroid. Cyst along the
medial aspect of the right ovary is likely physiologic in measures
roughly 2.7 cm.

Other: No abdominal wall hernia or abnormality. No abdominopelvic
ascites.

Musculoskeletal: No acute or significant osseous findings.
IMPRESSION: 1. Severe right-sided hydronephrosis and hydroureter secondary to an
obstructing calculus in the distal ureter nearly at the
ureterovesical junction and measuring 12 mm in greatest diameter.
2. New nonspecific upper to mid abdominal retroperitoneal lymph
nodes which are small to mildly enlarged in size with the largest
measuring 14 mm in short axis. These have enlarged since the prior
CT.

## 2020-04-17 IMAGING — CR DG CHEST 2V
2 series · 2 of 2 positions shown · non-contrast
Comparison: None.

CLINICAL DATA: Pt states having sudden chest pressure, dizziness,
SOB, and being cold/clammy at this 9am. Pt reports that pain is
across her breast area, has high bp, and anxiety. Hx of
hypertension, asthma, diabetes

EXAM:
CHEST - 2 VIEW

[chest pa]
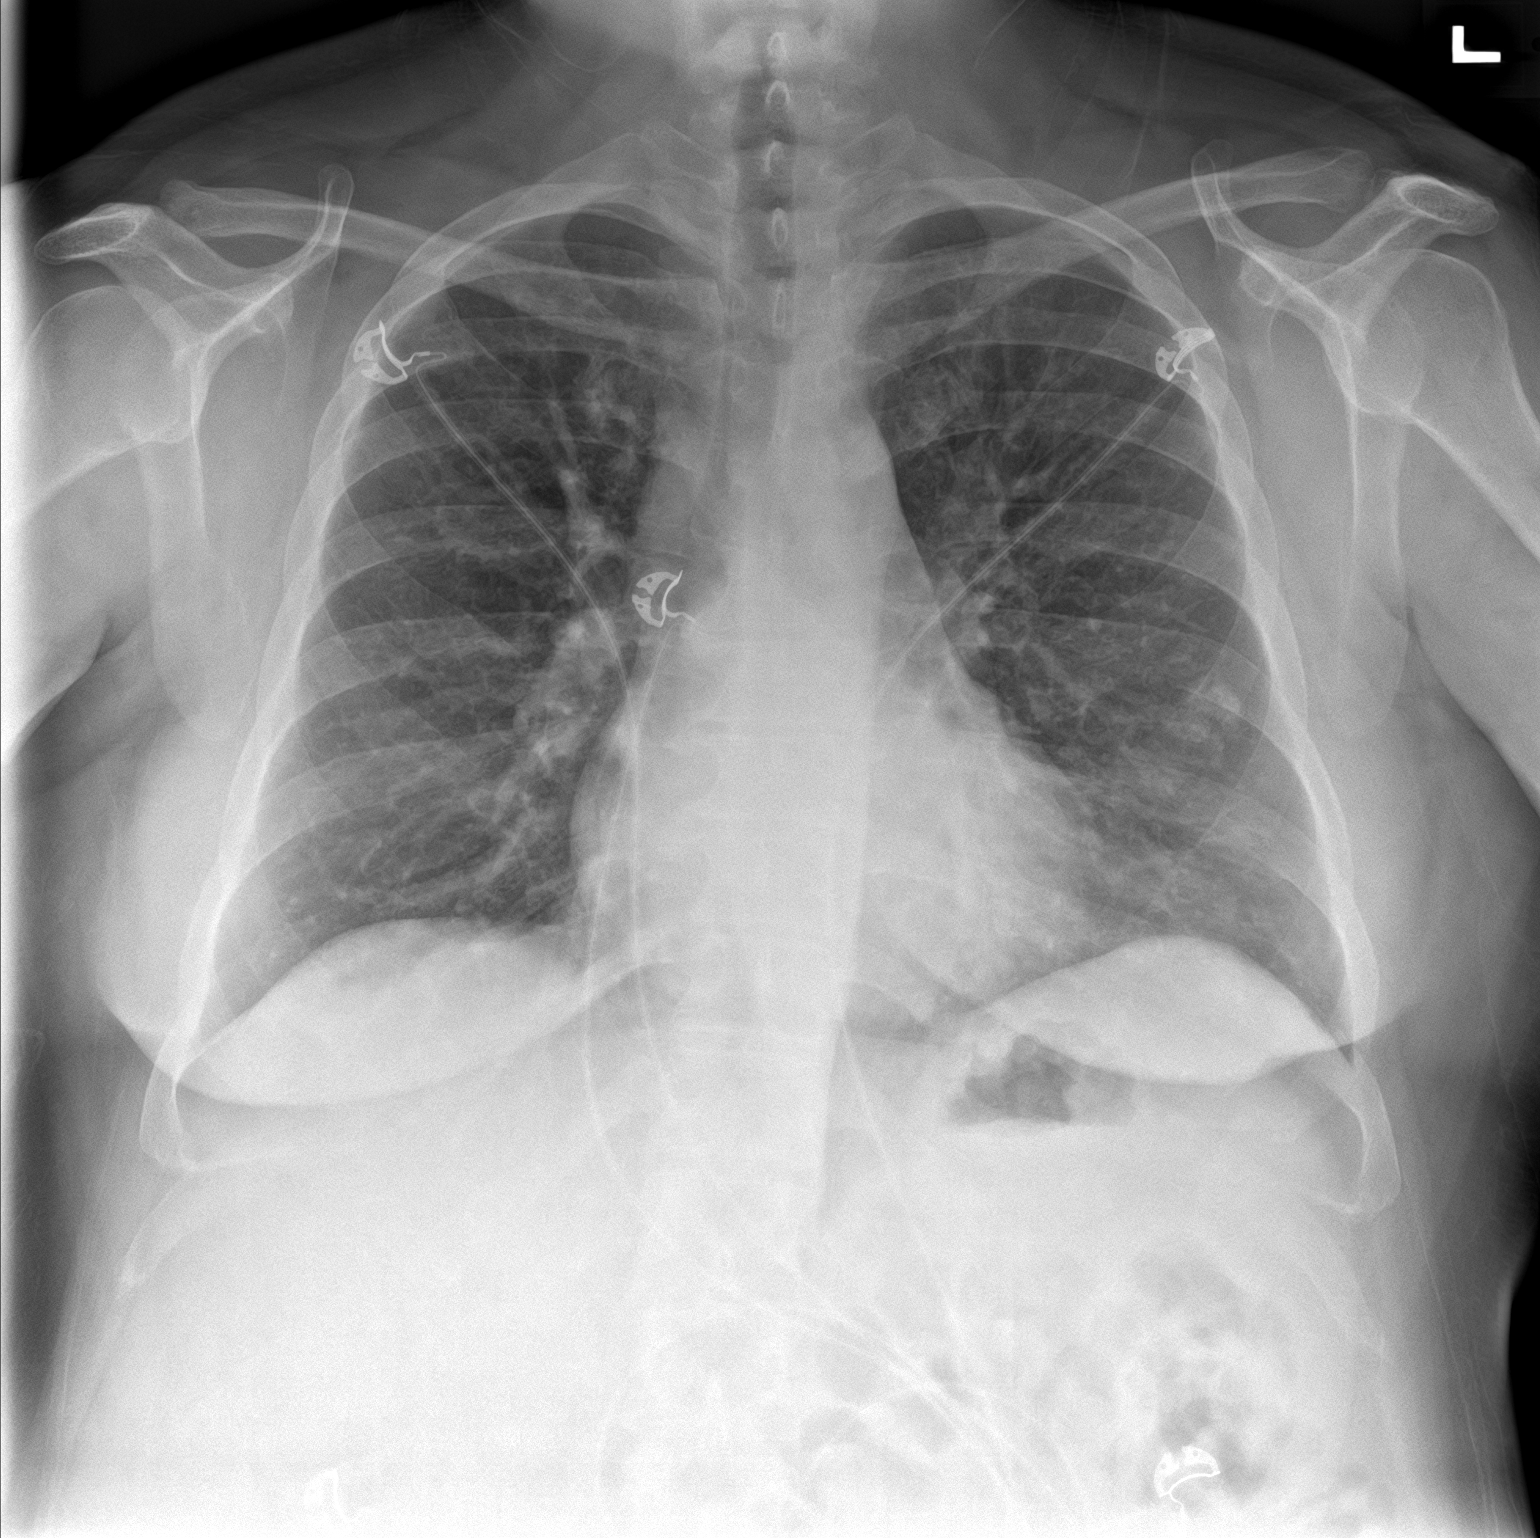

[chest lat]
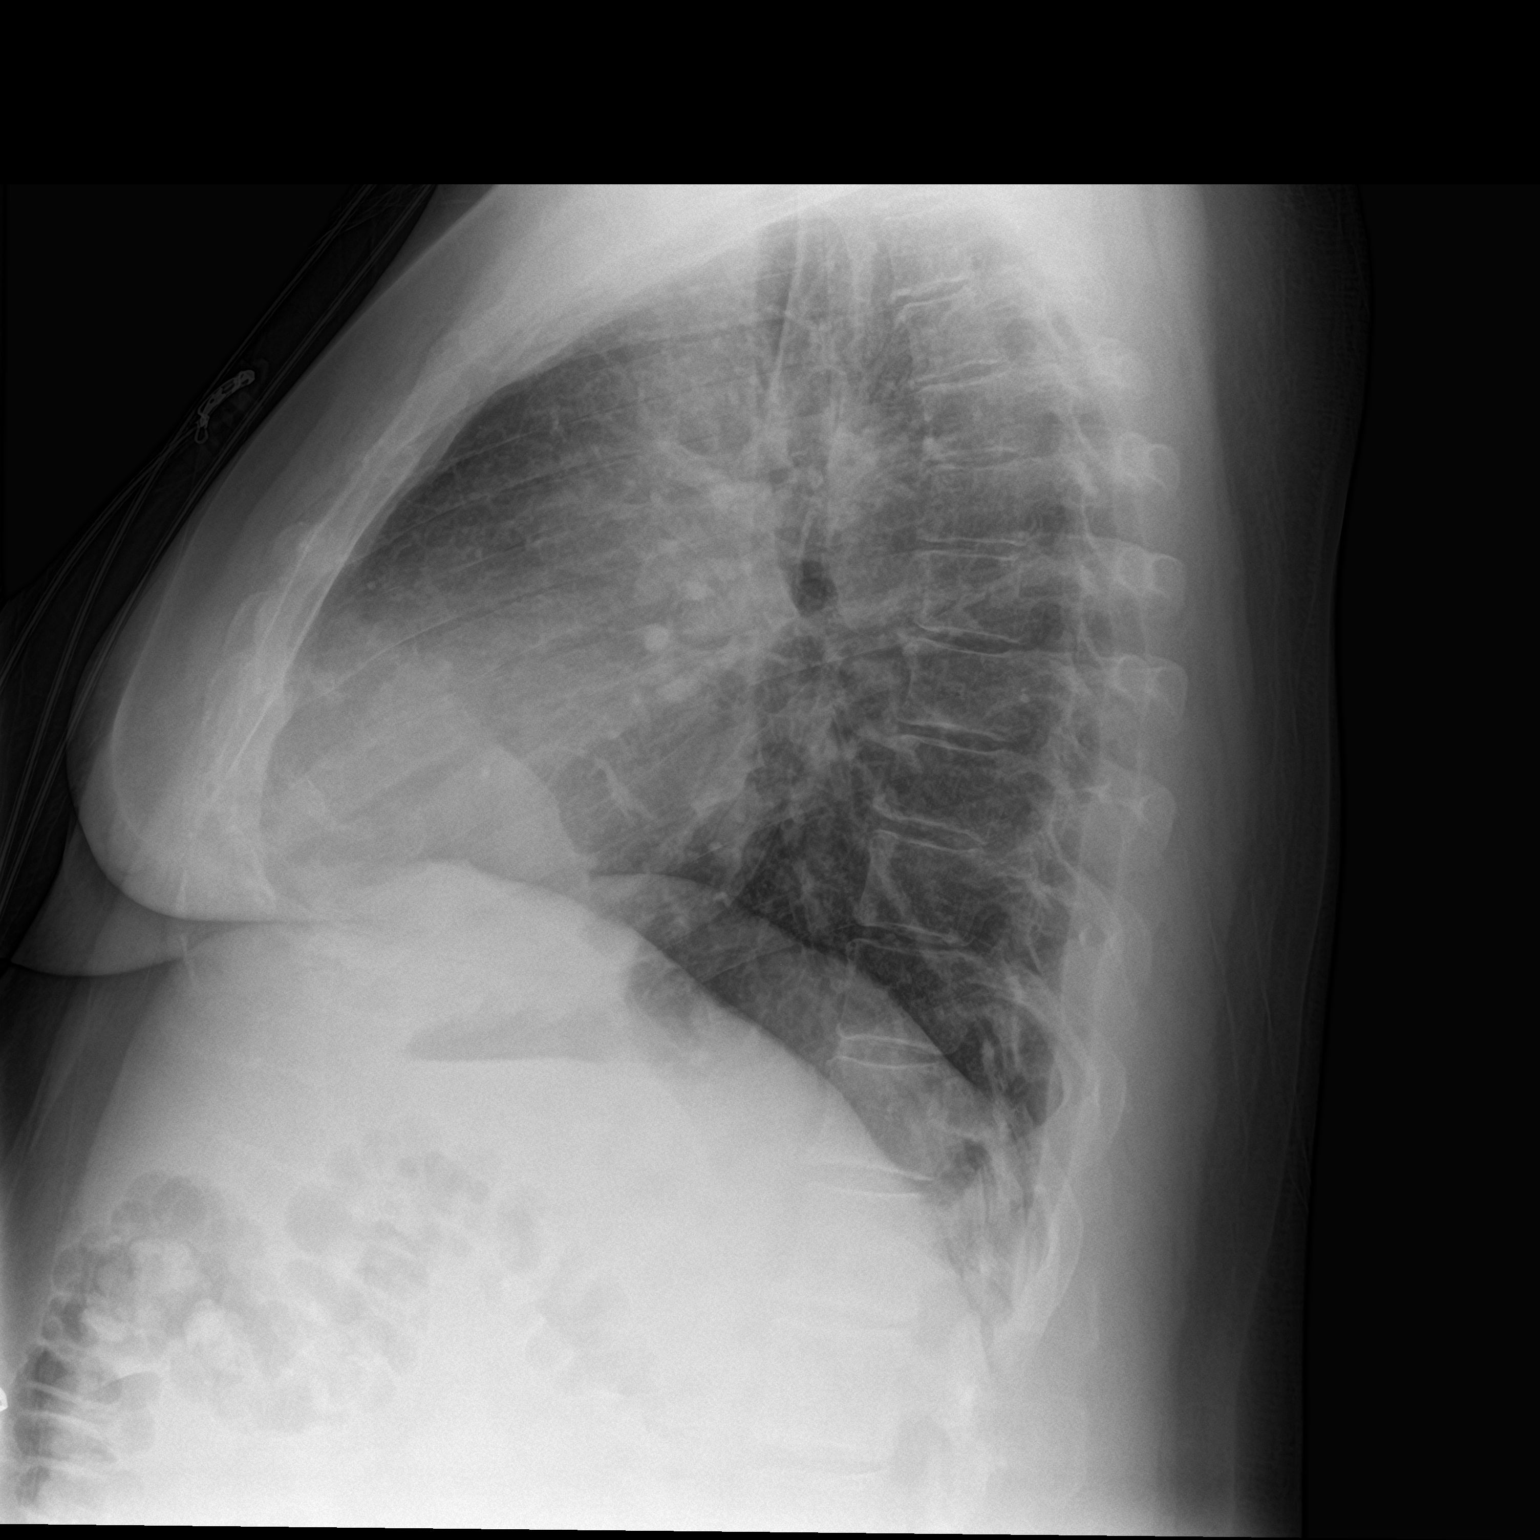

[2 of 2 positions shown; findings below may reference images not displayed]

FINDINGS: Cardiac silhouette is normal in size. No mediastinal or hilar
masses. No evidence of adenopathy.

Nodular opacity noted in the left mid lung, on the frontal view
only. Lungs otherwise clear. No pleural effusion or pneumothorax.

Skeletal structures are intact.
IMPRESSION: 1. No acute cardiopulmonary disease.
2. Possible left mid lung nodule. Recommend nonemergent follow-up
chest CT without contrast for further assessment.

## 2021-12-24 ENCOUNTER — Ambulatory Visit: Payer: Self-pay

## 2022-04-01 DIAGNOSIS — R69 Illness, unspecified: Secondary | ICD-10-CM | POA: Diagnosis not present

## 2022-04-01 DIAGNOSIS — Z8739 Personal history of other diseases of the musculoskeletal system and connective tissue: Secondary | ICD-10-CM | POA: Diagnosis not present

## 2022-04-01 DIAGNOSIS — E119 Type 2 diabetes mellitus without complications: Secondary | ICD-10-CM | POA: Diagnosis not present

## 2022-04-01 DIAGNOSIS — B354 Tinea corporis: Secondary | ICD-10-CM | POA: Diagnosis not present

## 2022-04-01 DIAGNOSIS — E781 Pure hyperglyceridemia: Secondary | ICD-10-CM | POA: Diagnosis not present

## 2022-04-01 DIAGNOSIS — I10 Essential (primary) hypertension: Secondary | ICD-10-CM | POA: Diagnosis not present

## 2022-04-01 DIAGNOSIS — E785 Hyperlipidemia, unspecified: Secondary | ICD-10-CM | POA: Diagnosis not present

## 2022-05-06 DIAGNOSIS — Z6841 Body Mass Index (BMI) 40.0 and over, adult: Secondary | ICD-10-CM | POA: Diagnosis not present

## 2022-05-06 DIAGNOSIS — L2489 Irritant contact dermatitis due to other agents: Secondary | ICD-10-CM | POA: Diagnosis not present

## 2022-06-08 DIAGNOSIS — U071 COVID-19: Secondary | ICD-10-CM | POA: Diagnosis not present

## 2022-06-08 DIAGNOSIS — R52 Pain, unspecified: Secondary | ICD-10-CM | POA: Diagnosis not present

## 2022-06-08 DIAGNOSIS — R42 Dizziness and giddiness: Secondary | ICD-10-CM | POA: Diagnosis not present

## 2022-06-08 DIAGNOSIS — R000 Tachycardia, unspecified: Secondary | ICD-10-CM | POA: Diagnosis not present

## 2022-06-08 DIAGNOSIS — R509 Fever, unspecified: Secondary | ICD-10-CM | POA: Diagnosis not present

## 2022-06-08 DIAGNOSIS — M2550 Pain in unspecified joint: Secondary | ICD-10-CM | POA: Diagnosis not present

## 2022-08-05 DIAGNOSIS — Z0000 Encounter for general adult medical examination without abnormal findings: Secondary | ICD-10-CM | POA: Diagnosis not present

## 2022-08-05 DIAGNOSIS — E119 Type 2 diabetes mellitus without complications: Secondary | ICD-10-CM | POA: Diagnosis not present

## 2022-08-12 DIAGNOSIS — E119 Type 2 diabetes mellitus without complications: Secondary | ICD-10-CM | POA: Diagnosis not present

## 2022-08-12 DIAGNOSIS — I10 Essential (primary) hypertension: Secondary | ICD-10-CM | POA: Diagnosis not present

## 2022-08-12 DIAGNOSIS — E785 Hyperlipidemia, unspecified: Secondary | ICD-10-CM | POA: Diagnosis not present

## 2022-08-12 DIAGNOSIS — Z0000 Encounter for general adult medical examination without abnormal findings: Secondary | ICD-10-CM | POA: Diagnosis not present

## 2022-08-12 DIAGNOSIS — N921 Excessive and frequent menstruation with irregular cycle: Secondary | ICD-10-CM | POA: Diagnosis not present

## 2022-08-12 DIAGNOSIS — R310 Gross hematuria: Secondary | ICD-10-CM | POA: Diagnosis not present

## 2022-08-12 DIAGNOSIS — R69 Illness, unspecified: Secondary | ICD-10-CM | POA: Diagnosis not present

## 2022-08-12 DIAGNOSIS — Z23 Encounter for immunization: Secondary | ICD-10-CM | POA: Diagnosis not present

## 2022-08-12 DIAGNOSIS — E781 Pure hyperglyceridemia: Secondary | ICD-10-CM | POA: Diagnosis not present

## 2022-11-05 DIAGNOSIS — E781 Pure hyperglyceridemia: Secondary | ICD-10-CM | POA: Diagnosis not present

## 2022-11-05 DIAGNOSIS — E119 Type 2 diabetes mellitus without complications: Secondary | ICD-10-CM | POA: Diagnosis not present

## 2022-11-12 DIAGNOSIS — B354 Tinea corporis: Secondary | ICD-10-CM | POA: Diagnosis not present

## 2022-11-12 DIAGNOSIS — E785 Hyperlipidemia, unspecified: Secondary | ICD-10-CM | POA: Diagnosis not present

## 2022-11-12 DIAGNOSIS — E119 Type 2 diabetes mellitus without complications: Secondary | ICD-10-CM | POA: Diagnosis not present

## 2023-02-11 DIAGNOSIS — E119 Type 2 diabetes mellitus without complications: Secondary | ICD-10-CM | POA: Diagnosis not present

## 2023-02-18 DIAGNOSIS — B349 Viral infection, unspecified: Secondary | ICD-10-CM | POA: Diagnosis not present

## 2023-02-18 DIAGNOSIS — I10 Essential (primary) hypertension: Secondary | ICD-10-CM | POA: Diagnosis not present

## 2023-02-18 DIAGNOSIS — Z8659 Personal history of other mental and behavioral disorders: Secondary | ICD-10-CM | POA: Diagnosis not present

## 2023-02-18 DIAGNOSIS — E785 Hyperlipidemia, unspecified: Secondary | ICD-10-CM | POA: Diagnosis not present

## 2023-02-18 DIAGNOSIS — E119 Type 2 diabetes mellitus without complications: Secondary | ICD-10-CM | POA: Diagnosis not present

## 2024-07-18 ENCOUNTER — Other Ambulatory Visit: Payer: Self-pay | Admitting: Internal Medicine

## 2024-07-18 DIAGNOSIS — Z1231 Encounter for screening mammogram for malignant neoplasm of breast: Secondary | ICD-10-CM
